# Patient Record
Sex: Female | Born: 1975 | Race: Black or African American | Hispanic: No | Marital: Married | Smoking: Never smoker
Health system: Southern US, Community
[De-identification: ages and names within clinical notes are randomized; demographics above are authoritative.]

## PROBLEM LIST (undated history)

## (undated) DIAGNOSIS — R0602 Shortness of breath: Secondary | ICD-10-CM

## (undated) DIAGNOSIS — F32A Depression, unspecified: Secondary | ICD-10-CM

## (undated) DIAGNOSIS — I10 Essential (primary) hypertension: Secondary | ICD-10-CM

## (undated) DIAGNOSIS — F419 Anxiety disorder, unspecified: Secondary | ICD-10-CM

## (undated) DIAGNOSIS — K219 Gastro-esophageal reflux disease without esophagitis: Secondary | ICD-10-CM

## (undated) DIAGNOSIS — G510 Bell's palsy: Secondary | ICD-10-CM

## (undated) DIAGNOSIS — E079 Disorder of thyroid, unspecified: Secondary | ICD-10-CM

## (undated) DIAGNOSIS — G500 Trigeminal neuralgia: Secondary | ICD-10-CM

## (undated) DIAGNOSIS — F329 Major depressive disorder, single episode, unspecified: Secondary | ICD-10-CM

## (undated) DIAGNOSIS — E119 Type 2 diabetes mellitus without complications: Secondary | ICD-10-CM

## (undated) DIAGNOSIS — E559 Vitamin D deficiency, unspecified: Secondary | ICD-10-CM

## (undated) DIAGNOSIS — D649 Anemia, unspecified: Secondary | ICD-10-CM

## (undated) HISTORY — PX: TUBAL LIGATION: SHX77

## (undated) HISTORY — DX: Anemia, unspecified: D64.9

## (undated) HISTORY — DX: Major depressive disorder, single episode, unspecified: F32.9

## (undated) HISTORY — DX: Gastro-esophageal reflux disease without esophagitis: K21.9

## (undated) HISTORY — DX: Trigeminal neuralgia: G50.0

## (undated) HISTORY — DX: Anxiety disorder, unspecified: F41.9

## (undated) HISTORY — DX: Depression, unspecified: F32.A

## (undated) HISTORY — DX: Vitamin D deficiency, unspecified: E55.9

## (undated) HISTORY — DX: Bell's palsy: G51.0

## (undated) HISTORY — DX: Shortness of breath: R06.02

---

## 1998-11-11 ENCOUNTER — Ambulatory Visit (HOSPITAL_COMMUNITY): Admission: RE | Admit: 1998-11-11 | Discharge: 1998-11-11 | Payer: Self-pay | Admitting: *Deleted

## 1999-02-17 ENCOUNTER — Encounter: Admission: RE | Admit: 1999-02-17 | Discharge: 1999-05-18 | Payer: Self-pay | Admitting: Gynecology

## 1999-04-06 ENCOUNTER — Encounter: Payer: Self-pay | Admitting: Gynecology

## 1999-04-06 ENCOUNTER — Encounter (HOSPITAL_COMMUNITY): Admission: RE | Admit: 1999-04-06 | Discharge: 1999-04-24 | Payer: Self-pay | Admitting: Gynecology

## 1999-04-22 ENCOUNTER — Inpatient Hospital Stay (HOSPITAL_COMMUNITY): Admission: AD | Admit: 1999-04-22 | Discharge: 1999-04-25 | Payer: Self-pay | Admitting: Gynecology

## 1999-04-22 ENCOUNTER — Encounter (INDEPENDENT_AMBULATORY_CARE_PROVIDER_SITE_OTHER): Payer: Self-pay

## 1999-04-25 ENCOUNTER — Encounter (HOSPITAL_COMMUNITY): Admission: RE | Admit: 1999-04-25 | Discharge: 1999-07-24 | Payer: Self-pay | Admitting: Gynecology

## 1999-05-29 ENCOUNTER — Other Ambulatory Visit: Admission: RE | Admit: 1999-05-29 | Discharge: 1999-05-29 | Payer: Self-pay | Admitting: Gynecology

## 2000-12-05 ENCOUNTER — Other Ambulatory Visit: Admission: RE | Admit: 2000-12-05 | Discharge: 2000-12-05 | Payer: Self-pay | Admitting: Gynecology

## 2001-04-11 ENCOUNTER — Encounter: Payer: Self-pay | Admitting: Emergency Medicine

## 2001-04-11 ENCOUNTER — Emergency Department (HOSPITAL_COMMUNITY): Admission: EM | Admit: 2001-04-11 | Discharge: 2001-04-11 | Payer: Self-pay | Admitting: Emergency Medicine

## 2001-08-08 ENCOUNTER — Encounter: Payer: Self-pay | Admitting: Emergency Medicine

## 2001-08-08 ENCOUNTER — Emergency Department (HOSPITAL_COMMUNITY): Admission: EM | Admit: 2001-08-08 | Discharge: 2001-08-08 | Payer: Self-pay | Admitting: *Deleted

## 2003-09-08 ENCOUNTER — Emergency Department (HOSPITAL_COMMUNITY): Admission: EM | Admit: 2003-09-08 | Discharge: 2003-09-08 | Payer: Self-pay | Admitting: Emergency Medicine

## 2004-03-21 ENCOUNTER — Emergency Department (HOSPITAL_COMMUNITY): Admission: EM | Admit: 2004-03-21 | Discharge: 2004-03-21 | Payer: Self-pay | Admitting: Family Medicine

## 2005-01-30 ENCOUNTER — Other Ambulatory Visit: Admission: RE | Admit: 2005-01-30 | Discharge: 2005-01-30 | Payer: Self-pay | Admitting: Obstetrics and Gynecology

## 2005-01-31 ENCOUNTER — Emergency Department (HOSPITAL_COMMUNITY): Admission: EM | Admit: 2005-01-31 | Discharge: 2005-01-31 | Payer: Self-pay | Admitting: Family Medicine

## 2005-04-20 ENCOUNTER — Emergency Department (HOSPITAL_COMMUNITY): Admission: EM | Admit: 2005-04-20 | Discharge: 2005-04-20 | Payer: Self-pay | Admitting: Family Medicine

## 2005-04-24 ENCOUNTER — Emergency Department (HOSPITAL_COMMUNITY): Admission: EM | Admit: 2005-04-24 | Discharge: 2005-04-24 | Payer: Self-pay | Admitting: Family Medicine

## 2006-01-27 ENCOUNTER — Emergency Department (HOSPITAL_COMMUNITY): Admission: EM | Admit: 2006-01-27 | Discharge: 2006-01-27 | Payer: Self-pay | Admitting: Emergency Medicine

## 2006-10-30 ENCOUNTER — Ambulatory Visit: Payer: Self-pay | Admitting: Internal Medicine

## 2006-11-04 ENCOUNTER — Emergency Department (HOSPITAL_COMMUNITY): Admission: EM | Admit: 2006-11-04 | Discharge: 2006-11-04 | Payer: Self-pay | Admitting: Emergency Medicine

## 2006-11-18 ENCOUNTER — Ambulatory Visit: Payer: Self-pay | Admitting: Internal Medicine

## 2006-11-18 LAB — CONVERTED CEMR LAB
ALT: 13 units/L (ref 0–40)
AST: 16 units/L (ref 0–37)
Alkaline Phosphatase: 60 units/L (ref 39–117)
BUN: 9 mg/dL (ref 6–23)
Basophils Absolute: 0 10*3/uL (ref 0.0–0.1)
Basophils Relative: 0.3 % (ref 0.0–1.0)
Bilirubin Urine: NEGATIVE
CO2: 28 meq/L (ref 19–32)
Calcium: 8.9 mg/dL (ref 8.4–10.5)
Chloride: 105 meq/L (ref 96–112)
Cholesterol: 140 mg/dL (ref 0–200)
Creatinine, Ser: 0.6 mg/dL (ref 0.4–1.2)
Crystals: NEGATIVE
Eosinophils Absolute: 0.1 10*3/uL (ref 0.0–0.6)
Eosinophils Relative: 1.4 % (ref 0.0–5.0)
GFR calc Af Amer: 153 mL/min
GFR calc non Af Amer: 127 mL/min
Glucose, Bld: 96 mg/dL (ref 70–99)
HCT: 32.1 % — ABNORMAL LOW (ref 36.0–46.0)
HDL: 45.3 mg/dL (ref >39.0)
Hemoglobin, Urine: NEGATIVE
Hemoglobin: 11.1 g/dL — ABNORMAL LOW (ref 12.0–15.0)
Ketones, ur: NEGATIVE mg/dL
LDL Cholesterol: 88 mg/dL (ref 0–99)
Leukocytes, UA: NEGATIVE
Lymphocytes Relative: 27.2 % (ref 12.0–46.0)
MCHC: 34.7 g/dL (ref 30.0–36.0)
MCV: 97.3 fL (ref 78.0–100.0)
Monocytes Absolute: 0.4 10*3/uL (ref 0.2–0.7)
Monocytes Relative: 7 % (ref 3.0–11.0)
Neutro Abs: 3.7 10*3/uL (ref 1.4–7.7)
Neutrophils Relative %: 64.1 % (ref 43.0–77.0)
Nitrite: NEGATIVE
Platelets: 348 10*3/uL (ref 150–400)
Potassium: 4.1 meq/L (ref 3.5–5.1)
RBC: 3.3 M/uL — ABNORMAL LOW (ref 3.87–5.11)
RDW: 11.9 % (ref 11.5–14.6)
Sed Rate: 37 mm/hr — ABNORMAL HIGH (ref 0–25)
Sodium: 139 meq/L (ref 135–145)
Specific Gravity, Urine: 1.02 (ref 1.000–1.03)
T3, Free: 3 pg/mL (ref 2.3–4.2)
T4, Total: 8.5 ug/dL (ref 5.0–12.5)
TSH: 0.93 microintl units/mL (ref 0.35–5.50)
Total CHOL/HDL Ratio: 3.1
Total Protein, Urine: NEGATIVE mg/dL
Triglycerides: 35 mg/dL (ref 0–149)
Urine Glucose: NEGATIVE mg/dL
Urobilinogen, UA: 0.2 (ref 0.0–1.0)
VLDL: 7 mg/dL (ref 0–40)
WBC, UA: NONE SEEN cells/hpf
WBC: 5.8 10*3/uL (ref 4.5–10.5)
pH: 6.5 (ref 5.0–8.0)

## 2006-12-08 ENCOUNTER — Emergency Department (HOSPITAL_COMMUNITY): Admission: EM | Admit: 2006-12-08 | Discharge: 2006-12-08 | Payer: Self-pay | Admitting: Family Medicine

## 2006-12-09 ENCOUNTER — Ambulatory Visit: Payer: Self-pay | Admitting: Internal Medicine

## 2007-01-27 ENCOUNTER — Emergency Department (HOSPITAL_COMMUNITY): Admission: EM | Admit: 2007-01-27 | Discharge: 2007-01-27 | Payer: Self-pay | Admitting: Family Medicine

## 2007-04-24 ENCOUNTER — Ambulatory Visit: Payer: Self-pay | Admitting: Internal Medicine

## 2007-04-24 LAB — CONVERTED CEMR LAB: hCG, Beta Chain, Quant, S: <0.5 milliintl units/mL

## 2008-06-26 ENCOUNTER — Emergency Department (HOSPITAL_COMMUNITY): Admission: EM | Admit: 2008-06-26 | Discharge: 2008-06-26 | Payer: Self-pay | Admitting: Family Medicine

## 2008-10-31 ENCOUNTER — Emergency Department (HOSPITAL_COMMUNITY): Admission: EM | Admit: 2008-10-31 | Discharge: 2008-10-31 | Payer: Self-pay | Admitting: Family Medicine

## 2009-03-08 ENCOUNTER — Ambulatory Visit: Payer: Self-pay

## 2009-06-20 ENCOUNTER — Ambulatory Visit: Payer: Self-pay | Admitting: Obstetrics and Gynecology

## 2009-06-29 ENCOUNTER — Ambulatory Visit (HOSPITAL_COMMUNITY): Admission: RE | Admit: 2009-06-29 | Discharge: 2009-06-29 | Payer: Self-pay | Admitting: Obstetrics and Gynecology

## 2009-10-31 ENCOUNTER — Inpatient Hospital Stay (HOSPITAL_COMMUNITY): Admission: RE | Admit: 2009-10-31 | Discharge: 2009-11-03 | Payer: Self-pay | Admitting: Obstetrics and Gynecology

## 2009-10-31 ENCOUNTER — Encounter (INDEPENDENT_AMBULATORY_CARE_PROVIDER_SITE_OTHER): Payer: Self-pay | Admitting: Obstetrics and Gynecology

## 2009-11-06 ENCOUNTER — Inpatient Hospital Stay (HOSPITAL_COMMUNITY): Admission: AD | Admit: 2009-11-06 | Discharge: 2009-11-06 | Payer: Self-pay | Admitting: Obstetrics and Gynecology

## 2009-12-27 ENCOUNTER — Emergency Department (HOSPITAL_COMMUNITY): Admission: EM | Admit: 2009-12-27 | Discharge: 2009-12-27 | Payer: Self-pay | Admitting: Emergency Medicine

## 2010-08-11 ENCOUNTER — Emergency Department (HOSPITAL_COMMUNITY): Admission: EM | Admit: 2010-08-11 | Discharge: 2010-08-11 | Payer: Self-pay | Admitting: Family Medicine

## 2010-09-13 DIAGNOSIS — G723 Periodic paralysis: Secondary | ICD-10-CM | POA: Insufficient documentation

## 2010-09-20 DIAGNOSIS — D649 Anemia, unspecified: Secondary | ICD-10-CM | POA: Insufficient documentation

## 2010-09-20 DIAGNOSIS — E559 Vitamin D deficiency, unspecified: Secondary | ICD-10-CM | POA: Insufficient documentation

## 2010-12-19 LAB — POCT I-STAT, CHEM 8
BUN: 13 mg/dL (ref 6–23)
Calcium, Ion: 1.21 mmol/L (ref 1.12–1.32)
Chloride: 102 mEq/L (ref 96–112)
Creatinine, Ser: 0.8 mg/dL (ref 0.4–1.2)
Glucose, Bld: 91 mg/dL (ref 70–99)
HCT: 33 % — ABNORMAL LOW (ref 36.0–46.0)
Hemoglobin: 11.2 g/dL — ABNORMAL LOW (ref 12.0–15.0)
Potassium: 3.6 mEq/L (ref 3.5–5.1)
Sodium: 140 mEq/L (ref 135–145)
TCO2: 27 mmol/L (ref 0–100)

## 2010-12-24 LAB — CBC
HCT: 26.3 % — ABNORMAL LOW (ref 36.0–46.0)
HCT: 27 % — ABNORMAL LOW (ref 36.0–46.0)
HCT: 32.1 % — ABNORMAL LOW (ref 36.0–46.0)
Hemoglobin: 8.6 g/dL — ABNORMAL LOW (ref 12.0–15.0)
Hemoglobin: 9 g/dL — ABNORMAL LOW (ref 12.0–15.0)
MCHC: 32.8 g/dL (ref 30.0–36.0)
Platelets: 264 10*3/uL (ref 150–400)
Platelets: 314 10*3/uL (ref 150–400)
Platelets: 471 10*3/uL — ABNORMAL HIGH (ref 150–400)
RBC: 2.8 MIL/uL — ABNORMAL LOW (ref 3.87–5.11)
RDW: 13.8 % (ref 11.5–15.5)
RDW: 14 % (ref 11.5–15.5)
WBC: 7.1 10*3/uL (ref 4.0–10.5)
WBC: 8.8 10*3/uL (ref 4.0–10.5)

## 2010-12-24 LAB — URIC ACID: Uric Acid, Serum: 4.7 mg/dL (ref 2.4–7.0)

## 2010-12-24 LAB — RPR: RPR Ser Ql: NONREACTIVE

## 2010-12-24 LAB — BASIC METABOLIC PANEL
BUN: 5 mg/dL — ABNORMAL LOW (ref 6–23)
Calcium: 8.5 mg/dL (ref 8.4–10.5)
GFR calc non Af Amer: >60 mL/min (ref >60)
Glucose, Bld: 104 mg/dL — ABNORMAL HIGH (ref 70–99)
Potassium: 3.6 mEq/L (ref 3.5–5.1)
Sodium: 134 mEq/L — ABNORMAL LOW (ref 135–145)

## 2010-12-24 LAB — URINALYSIS, DIPSTICK ONLY
Glucose, UA: NEGATIVE mg/dL
Leukocytes, UA: NEGATIVE
Specific Gravity, Urine: 1.01 (ref 1.005–1.030)
pH: 6.5 (ref 5.0–8.0)

## 2010-12-24 LAB — COMPREHENSIVE METABOLIC PANEL
Albumin: 2.1 g/dL — ABNORMAL LOW (ref 3.5–5.2)
Albumin: 2.5 g/dL — ABNORMAL LOW (ref 3.5–5.2)
Alkaline Phosphatase: 89 U/L (ref 39–117)
BUN: 3 mg/dL — ABNORMAL LOW (ref 6–23)
BUN: 7 mg/dL (ref 6–23)
CO2: 27 mEq/L (ref 19–32)
Calcium: 8.3 mg/dL — ABNORMAL LOW (ref 8.4–10.5)
Chloride: 103 mEq/L (ref 96–112)
Glucose, Bld: 164 mg/dL — ABNORMAL HIGH (ref 70–99)
Potassium: 4.2 mEq/L (ref 3.5–5.1)
Total Bilirubin: 0.3 mg/dL (ref 0.3–1.2)
Total Protein: 4.7 g/dL — ABNORMAL LOW (ref 6.0–8.3)

## 2010-12-24 LAB — GLUCOSE, CAPILLARY: Glucose-Capillary: 95 mg/dL (ref 70–99)

## 2010-12-24 LAB — LACTATE DEHYDROGENASE: LDH: 173 U/L (ref 94–250)

## 2011-01-22 LAB — POCT URINALYSIS DIP (DEVICE)
Protein, ur: NEGATIVE mg/dL
Specific Gravity, Urine: 1.025 (ref 1.005–1.030)
Urobilinogen, UA: 0.2 mg/dL (ref 0.0–1.0)

## 2011-01-22 LAB — POCT I-STAT, CHEM 8
Creatinine, Ser: 0.9 mg/dL (ref 0.4–1.2)
Glucose, Bld: 82 mg/dL (ref 70–99)
Hemoglobin: 11.9 g/dL — ABNORMAL LOW (ref 12.0–15.0)
Sodium: 141 mEq/L (ref 135–145)
TCO2: 25 mmol/L (ref 0–100)

## 2011-01-22 LAB — POCT PREGNANCY, URINE: Preg Test, Ur: NEGATIVE

## 2011-02-20 DIAGNOSIS — R070 Pain in throat: Secondary | ICD-10-CM | POA: Insufficient documentation

## 2011-02-23 NOTE — Assessment & Plan Note (Signed)
[facility]                           PRIMARY CARE OFFICE NOTE   MADINA, GALATI                    MRN:          [id number]  DATE:10/30/2006                            DOB:          07-18-1976    CHIEF COMPLAINT:  New patient to practice.   HISTORY OF PRESENT ILLNESS:  The patient is  a 35 year old African-  American female here to establish primary care.  She has not had a  primary care physician in the past.  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   She has been fairly healthy for most of her life, however in 2005 she  had abnormal sensation in the left side of the face and was seen in the  emergency department.  They made a presumptive diagnosis of possible  trigeminal neuralgia versus Bell's palsy.  She describes having numbness  sensation in the left side of her face.  She did not report any muscle  weakness of the face.   Since that time, she has not had any further recurrences.  She does  occasionally get headaches which she attributes to stress. When she was  working in the ICU, she had frequent headaches, one to two per work.  Now that she is in a less stressful job, she has not had any recurrence  of headaches.   She occasionally experiences tightness of her chest during stressful  time periods.  She has never experienced chest pain with vigorous  exercise.  She runs on a treadmill several times a week.  Chest  discomfort is intermittent and only lasts a few seconds.  She denies any  history of gastroesophageal reflux disease.  In addition, there are no  other associated symptoms such as neck discomfort, left arm discomfort,  diaphoresis, or shortness of breath.   SOCIAL HISTORY:  The patient is single.  She does have 2 children,  twins, a boy and a girl age 36.  Employment history is noted above.   CURRENT  MEDICATIONS:  None.   ALLERGIES:  To medications none.   FAMILY HISTORY:  Father's medical history is unknown.  Mother is alive  at age 38, has hypertension and over-active thyroid.  Similar issues  with her sister.  Aunt is known to have breast cancer.   HABITS:  She seldom drinks.  No history of tobacco use or illicit drug  use in the past.   PREVENTATIVE CARE HISTORY:  Her last Pap was in 2007.  She has never had  a mammogram in the past.   REVIEW OF SYSTEMS:  As noted above, no further issues with headache.  The patient denies any double vision, blurry vision, no HEENT symptoms,  cardiovascular or chest pain symptoms as noted above.  Denies heartburn,  nausea and vomiting, constipation, diarrhea.  No history of stumbling,  no history of dropping items.  All systems negative.   PHYSICAL EXAMINATION:  VITALS:  Height is 5 feet 6 inches, weight is 190  pounds,  temperature is 97.5, pulse 58, blood pressure is 120/78 on the  left side in a seated position.  IN GENERAL:  The patient is a pleasant, somewhat overweight 35 year old  African-American female in no apparent distress.  HEENT:  Normocephalic and atraumatic.  Pupils are equal and reactive to  light.  Extraocular movements intact.  The patient is anicteric.  Conjunctiva is within normal limits.  Oropharyngeal exam is  unremarkable.  Gross testing of her face to light touch and pin prick  was equal and symmetric on both sides.  No appreciable defect in her  facial muscles when compared side by side.  LUNGS:  Clear bilaterally.  NECK:  Supple, somewhat thickened but no adenopathy, carotid bruits or  thyromegaly.  CHEST:  Normal expiratory effort.  Chest was clear to auscultation  bilaterally.  No rhonchi, rales or wheezing.  CARDIOVASCULAR:  Regular rate and rhythm, no significant murmurs, rubs  or gallops appreciated.  ABDOMEN:  Slightly protuberant, nontender, positive bowel sounds, no  organomegaly.  MUSCULOSKELETAL:  No  cyanosis, clubbing or edema.  SKIN:  Warm and dry.  NEUROLOGIC:  Cranial nerves II-XII grossly intact.  She was nonfocal.  Reflexes were +1 to 2 upper and lower, symmetric.  No Babinski noted.   IMPRESSION/RECOMMENDATIONS:  1. Remote history of trigeminal neuropathy.  2. Intermittent atypical chest pain.  3. Obesity.  4. Health maintenance.   RECOMMENDATIONS:  At this time, the patient has no neurologic complaints  and no neurologic abnormality that would warrant further testing or  imaging.  The patient had a chest x-ray today with complaints of chest  tightness and wanted to rule out possibility of sarcoidosis.  Her chest  x-ray was unremarkable.  There was no active disease, no other  adenopathy.   We discussed various strategies for weight loss.  We will rule out any  endocrine reason for difficulty with weight loss.  Thyroid studies will  be checked and also she will be screened for diabetes.   If the patient has any further issues with headache or abnormal  sensation in the face, we discussed ordering an MRI of the brain.   Follow up times in approximately 6 weeks.     Barbette Hair. Artist Pais, DO  Electronically Signed    RDY/MedQ  DD: 10/30/2006  DT: 10/30/2006  Job #: [id number]

## 2011-04-02 DIAGNOSIS — R197 Diarrhea, unspecified: Secondary | ICD-10-CM | POA: Insufficient documentation

## 2011-04-02 DIAGNOSIS — R109 Unspecified abdominal pain: Secondary | ICD-10-CM | POA: Insufficient documentation

## 2011-05-24 ENCOUNTER — Ambulatory Visit: Payer: Self-pay | Admitting: Obstetrics and Gynecology

## 2011-06-04 ENCOUNTER — Encounter (INDEPENDENT_AMBULATORY_CARE_PROVIDER_SITE_OTHER): Payer: Self-pay

## 2011-06-18 DIAGNOSIS — L732 Hidradenitis suppurativa: Secondary | ICD-10-CM | POA: Insufficient documentation

## 2011-06-26 ENCOUNTER — Ambulatory Visit (INDEPENDENT_AMBULATORY_CARE_PROVIDER_SITE_OTHER): Payer: PRIVATE HEALTH INSURANCE | Admitting: General Surgery

## 2011-07-03 ENCOUNTER — Ambulatory Visit (INDEPENDENT_AMBULATORY_CARE_PROVIDER_SITE_OTHER): Payer: PRIVATE HEALTH INSURANCE | Admitting: General Surgery

## 2011-10-07 ENCOUNTER — Emergency Department (INDEPENDENT_AMBULATORY_CARE_PROVIDER_SITE_OTHER)
Admission: EM | Admit: 2011-10-07 | Discharge: 2011-10-07 | Disposition: A | Discharge status: Home / Self Care | Payer: PRIVATE HEALTH INSURANCE | Source: Home / Self Care | Attending: Emergency Medicine | Admitting: Emergency Medicine

## 2011-10-07 ENCOUNTER — Encounter (HOSPITAL_COMMUNITY): Payer: Self-pay | Admitting: *Deleted

## 2011-10-07 DIAGNOSIS — J40 Bronchitis, not specified as acute or chronic: Secondary | ICD-10-CM

## 2011-10-07 MED ORDER — ALBUTEROL SULFATE HFA 108 (90 BASE) MCG/ACT IN AERS: 1.0000 | INHALATION_SPRAY | Freq: Four times a day (QID) | RESPIRATORY_TRACT | Status: DC | PRN

## 2011-10-07 MED ORDER — GUAIFENESIN-CODEINE 100-10 MG/5ML PO SYRP: 10.0000 mL | ORAL_SOLUTION | Freq: Four times a day (QID) | ORAL | Status: AC | PRN

## 2011-10-07 MED ORDER — AZITHROMYCIN 250 MG PO TABS: ORAL_TABLET | ORAL | Status: AC

## 2011-10-07 NOTE — ED Provider Notes (Signed)
History     CSN: [id number]  Arrival date & time 10/07/11  1110   First MD Initiated Contact with Patient 10/07/11 1227      Chief Complaint  Patient presents with  . Cough  . Nasal Congestion  . Fever    (Consider location/radiation/quality/duration/timing/severity/associated sxs/prior treatment) HPI Comments: Melissa Franklin is a 35 year old female who has had a two-week history of a cough that seems to be getting worse. Sometimes nonproductive and sometimes productive of moderate amounts of yellow to brown sputum. She denies any hemoptysis. Her chest hurts when she coughs and she feels moderately short of breath when she coughs. She has had some wheezing. She denies any history of asthma. She notes aching in her neck and she's felt hot but denies any fever, chills, nasal congestion, rhinorrhea, sore throat, vomiting, or diarrhea. She has been exposed to several family members who have influenza-like illnesses.  Patient is a 35 y.o. female presenting with cough and fever.  Cough Associated symptoms include chest pain, myalgias and wheezing. Pertinent negatives include no chills, no ear pain, no rhinorrhea, no sore throat, no shortness of breath and no eye redness.  Fever Primary symptoms of the febrile illness include fever, cough, wheezing and myalgias. Primary symptoms do not include fatigue, shortness of breath, abdominal pain, nausea, vomiting, diarrhea or rash.    Past Medical History  Diagnosis Date  . Anxiety   . SOB (shortness of breath)   . Depression     Past Surgical History  Procedure Date  . Tubal ligation     Family History  Problem Relation Age of Onset  . Diabetes    . Breast cancer Maternal Aunt     2 maternal aunts and cousin  . Thyroid disease      History  Substance Use Topics  . Smoking status: Never Smoker   . Smokeless tobacco: Not on file  . Alcohol Use: No    OB History    Grav Para Term Preterm Abortions TAB SAB Ect Mult Living            Review of Systems  Constitutional: Positive for fever. Negative for chills and fatigue.  HENT: Negative for ear pain, congestion, sore throat, rhinorrhea, sneezing, neck stiffness, voice change and postnasal drip.   Eyes: Negative for pain, discharge and redness.  Respiratory: Positive for cough and wheezing. Negative for chest tightness and shortness of breath.   Cardiovascular: Positive for chest pain.  Gastrointestinal: Negative for nausea, vomiting, abdominal pain and diarrhea.  Musculoskeletal: Positive for myalgias.  Skin: Negative for rash.    Allergies  Review of patient's allergies indicates no known allergies.  Home Medications   Current Outpatient Rx  Name Route Sig Dispense Refill  . VITAMIN D PO Oral Take by mouth.      . NYQUIL PO Oral Take by mouth.      . DAYQUIL PO Oral Take by mouth.      . SERTRALINE HCL 50 MG PO TABS Oral Take 50 mg by mouth daily.      . ALBUTEROL SULFATE HFA 108 (90 BASE) MCG/ACT IN AERS Inhalation Inhale 1-2 puffs into the lungs every 6 (six) hours as needed for wheezing. 1 Inhaler 0  . AZITHROMYCIN 250 MG PO TABS  Take as directed. 6 tablet 0  . IRON CR PO Oral Take by mouth.      . GUAIFENESIN-CODEINE 100-10 MG/5ML PO SYRP Oral Take 10 mLs by mouth 4 (four) times daily as needed for  cough. 120 mL 0    BP 124/75  Pulse 77  Temp(Src) 99.2 F (37.3 C) (Oral)  Resp 18  SpO2 100%  LMP 09/07/2011  Physical Exam  Nursing note and vitals reviewed. Constitutional: She appears well-developed and well-nourished. No distress.  HENT:  Head: Normocephalic and atraumatic.  Right Ear: External ear normal.  Left Ear: External ear normal.  Nose: Nose normal.  Mouth/Throat: Oropharynx is clear and moist. No oropharyngeal exudate.  Eyes: Conjunctivae and EOM are normal. Pupils are equal, round, and reactive to light. Right eye exhibits no discharge. Left eye exhibits no discharge.  Neck: Normal range of motion. Neck supple.    Cardiovascular: Normal rate, regular rhythm and normal heart sounds.   Pulmonary/Chest: Effort normal and breath sounds normal. No stridor. No respiratory distress. She has no wheezes. She has no rales. She exhibits no tenderness.  Lymphadenopathy:    She has no cervical adenopathy.  Skin: Skin is warm and dry. No rash noted. She is not diaphoretic.    ED Course  Procedures (including critical care time)  Labs Reviewed - No data to display No results found.   1. Bronchitis       MDM  She has symptoms of bronchitis which been going on for over 2 weeks. I think at this point she needs antibiotics and have given her prescriptions for Z-Pak, guaifenesin/codeine cough syrup, and an albuterol inhaler.        Roque Lias, MD 10/07/11 [phone]

## 2011-10-07 NOTE — ED Notes (Signed)
Pt with osnet of cough and congestion x 2 weeks progressively worse now chest sore with coughing rates pain 8 with cough -

## 2011-12-19 DIAGNOSIS — K219 Gastro-esophageal reflux disease without esophagitis: Secondary | ICD-10-CM | POA: Insufficient documentation

## 2011-12-19 DIAGNOSIS — Z0289 Encounter for other administrative examinations: Secondary | ICD-10-CM | POA: Insufficient documentation

## 2011-12-19 DIAGNOSIS — E669 Obesity, unspecified: Secondary | ICD-10-CM | POA: Insufficient documentation

## 2011-12-19 DIAGNOSIS — F32A Depression, unspecified: Secondary | ICD-10-CM | POA: Insufficient documentation

## 2012-08-07 DIAGNOSIS — J0100 Acute maxillary sinusitis, unspecified: Secondary | ICD-10-CM | POA: Insufficient documentation

## 2012-08-07 DIAGNOSIS — H6690 Otitis media, unspecified, unspecified ear: Secondary | ICD-10-CM | POA: Insufficient documentation

## 2012-10-11 ENCOUNTER — Emergency Department (HOSPITAL_COMMUNITY)
Admission: EM | Admit: 2012-10-11 | Discharge: 2012-10-11 | Disposition: A | Discharge status: Home / Self Care | Payer: BC Managed Care – PPO | Attending: Emergency Medicine | Admitting: Emergency Medicine

## 2012-10-11 ENCOUNTER — Encounter (HOSPITAL_COMMUNITY): Payer: Self-pay | Admitting: Nurse Practitioner

## 2012-10-11 ENCOUNTER — Emergency Department (HOSPITAL_COMMUNITY): Payer: BC Managed Care – PPO

## 2012-10-11 DIAGNOSIS — Z8659 Personal history of other mental and behavioral disorders: Secondary | ICD-10-CM | POA: Insufficient documentation

## 2012-10-11 DIAGNOSIS — Z8709 Personal history of other diseases of the respiratory system: Secondary | ICD-10-CM | POA: Insufficient documentation

## 2012-10-11 DIAGNOSIS — R209 Unspecified disturbances of skin sensation: Secondary | ICD-10-CM | POA: Insufficient documentation

## 2012-10-11 DIAGNOSIS — R0789 Other chest pain: Secondary | ICD-10-CM | POA: Insufficient documentation

## 2012-10-11 DIAGNOSIS — R079 Chest pain, unspecified: Secondary | ICD-10-CM

## 2012-10-11 LAB — BASIC METABOLIC PANEL
BUN: 10 mg/dL (ref 6–23)
Creatinine, Ser: 0.76 mg/dL (ref 0.50–1.10)
GFR calc non Af Amer: >90 mL/min (ref >90)
Glucose, Bld: 96 mg/dL (ref 70–99)
Potassium: 3.7 mEq/L (ref 3.5–5.1)

## 2012-10-11 LAB — CBC
HCT: 31.8 % — ABNORMAL LOW (ref 36.0–46.0)
Hemoglobin: 10.2 g/dL — ABNORMAL LOW (ref 12.0–15.0)
MCH: 30 pg (ref 26.0–34.0)
MCHC: 32.1 g/dL (ref 30.0–36.0)
RDW: 13 % (ref 11.5–15.5)

## 2012-10-11 LAB — POCT I-STAT TROPONIN I: Troponin i, poc: 0 ng/mL (ref 0.00–0.08)

## 2012-10-11 NOTE — ED Provider Notes (Signed)
History     CSN: [id number]  Arrival date & time 10/11/12  1257   First MD Initiated Contact with Patient 10/11/12 1505      Chief Complaint  Patient presents with  . Chest Pain    (Consider location/radiation/quality/duration/timing/severity/associated sxs/prior treatment) HPI Melissa Franklin is a 37 y.o. female who presents with complaint of chest pain, left arm and left leg numbness. States symptoms first began with left jaw discomfort that she noted about a year ago. States went to a dentist, was told she may have tigeminal neuralgia and got mouth guards. 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 Stats also more recently noted that her left arm would swell at the wrist joint, and would have left arm discomfort that also comes and goes and non exertional. States today she was laying in bed and started feeling pressure in left chest. She states she did not have associated jaw pain, left arm pain, or left leg pain or numbness or tingling. Denies shortness of breath, nausea, diaphoresis. States has been stressed out due to family deaths and other stressors. Thought it may be anxiety, took xanax with no improvement. Pt denies any medical problems, denies family hx of cardiac problems, denies hx of the same. Not a smoker. Not on birth control, no recent travel or surgeries.   Past Medical History  Diagnosis Date  . Anxiety   . SOB (shortness of breath)   . Depression     Past Surgical History  Procedure Date  . Tubal ligation   . Cesarean section     Family History  Problem Relation Age of Onset  . Diabetes    . Breast cancer Maternal Aunt     2 maternal aunts and cousin  . Thyroid disease      History  Substance Use Topics  . Smoking status: Never Smoker   . Smokeless tobacco: Not on file  . Alcohol Use: No    OB History    Grav Para Term Preterm Abortions TAB SAB Ect Mult Living                  Review of Systems  Constitutional: Negative for fever and chills.  HENT: Negative for neck pain and neck stiffness.   Respiratory: Positive for chest tightness. Negative for cough and shortness of breath.   Cardiovascular: Positive for chest pain. Negative for palpitations and leg swelling.  Neurological: Negative for weakness and numbness.  All other systems reviewed and are negative.    Allergies  Review of patient's allergies indicates no known allergies.  Home Medications   Current Outpatient Rx  Name  Route  Sig  Dispense  Refill  . OMEPRAZOLE 40 MG PO CPDR   Oral   Take 40 mg by mouth daily.         . SERTRALINE HCL 100 MG PO TABS   Oral   Take 100 mg by mouth daily.         Marland Kitchen VITAMIN D (ERGOCALCIFEROL) 50000 UNITS PO CAPS   Oral   Take 50,000 Units by mouth 3 (three) times a week.           BP 125/70  Pulse 85  Temp 97.8 F (36.6 C) (Oral)  Resp 15  SpO2 99%  Physical Exam  Nursing note and vitals reviewed. Constitutional: She is oriented to person, place, and time. She appears  well-developed and well-nourished. No distress.  Neck: Neck supple.  Cardiovascular: Normal rate, regular rhythm and normal heart sounds.   Pulmonary/Chest: Effort normal and breath sounds normal. No respiratory distress. She has no wheezes. She has no rales.  Abdominal: Soft. Bowel sounds are normal. She exhibits no distension. There is no tenderness. There is no rebound.  Musculoskeletal: She exhibits no edema.       Normal appearing left arm, hand, with no swelling or tenderness. Normal distal radial pulse. Normal appearing left leg with no swelling, edema, tenderness. Normal dorsal pedal pulses bilaterally  Neurological: She is alert and oriented to person, place, and time.  Skin: Skin is warm and dry.  Psychiatric: She has a normal mood and affect. Her behavior is normal.    ED Course  Procedures (including critical care time)   Date: 10/11/2012   Rate: 98  Rhythm: normal sinus rhythm  QRS Axis: normal  Intervals: normal  ST/T Wave abnormalities: normal  Conduction Disutrbances:none  Narrative Interpretation:   Old EKG Reviewed: none available  Pt with atypical chest pain. She is PERC negative. She is non toxic appearing. Currently chest pain free. Labs as follow   Results for orders placed during the hospital encounter of 10/11/12  BASIC METABOLIC PANEL      Component Value Range   Sodium 136  135 - 145 mEq/L   Potassium 3.7  3.5 - 5.1 mEq/L   Chloride 101  96 - 112 mEq/L   CO2 28  19 - 32 mEq/L   Glucose, Bld 96  70 - 99 mg/dL   BUN 10  6 - 23 mg/dL   Creatinine, Ser 1.61  0.50 - 1.10 mg/dL   Calcium 9.2  8.4 - 09.6 mg/dL   GFR calc non Af Amer >90  >90 mL/min   GFR calc Af Amer >90  >90 mL/min  CBC      Component Value Range   WBC 6.6  4.0 - 10.5 K/uL   RBC 3.40 (*) 3.87 - 5.11 MIL/uL   Hemoglobin 10.2 (*) 12.0 - 15.0 g/dL   HCT 04.5 (*) 40.9 - 81.1 %   MCV 93.5  78.0 - 100.0 fL   MCH 30.0  26.0 - 34.0 pg   MCHC 32.1  30.0 - 36.0 g/dL   RDW 91.4  78.2 - 95.6 %   Platelets 344  150 - 400 K/uL  POCT I-STAT TROPONIN I      Component Value Range   Troponin i, poc 0.00  0.00 - 0.08 ng/mL   Comment 3            Dg Chest 2 View  10/11/2012  *RADIOLOGY REPORT*  Clinical Data: Chest pain.  Left upper extremity weakness.  CHEST - 2 VIEW  Comparison: None.  Findings: Cardiomediastinal silhouette unremarkable.  Lungs clear. Bronchovascular markings normal.  Pulmonary vascularity normal.  No pleural effusions.  No pneumothorax.  Visualized bony thorax intact.  IMPRESSION: Normal examination.   Original Report Authenticated By: Hulan Saas, M.D.      6:03 PM 2nd troponin is negative. Pt is pain free. VS normal.  1. Chest pain       MDM  Pt with intermittent left arm and leg tingling for "months" and CP that started today, non exertional, lasting less than a minute. Pain sounds atypical. She has NO risk factors for  CAD. She is TIMI 0. She is PERC negative. VS are normal. Doubt ACS, doubt PE. No signs of pericarditis. Will d/c  home in stable condition with PCP follow up in 2-3 days.   Filed Vitals:   10/11/12 1650  BP: 112/65  Pulse: 83  Temp:   Resp: 15           Oprah Camarena A Simrat Kendrick, PA 10/11/12 1806

## 2012-10-11 NOTE — ED Notes (Addendum)
Reports she "has been under a lot of stress for a while" and has been clenching her teeth and feeling intermittent numbness in L leg. Then today began to feel "Tightness" in chest and feels L arm is swollen. Pain has been intermittent throughout the day today, no noted activity at onset, nothing relieves the pain. No pain at this time. A&Ox4, resp e/u. Pt did take a xanax that made her feel sleepy but didn't change teh pain

## 2012-10-11 NOTE — ED Notes (Signed)
Pt reports left arm "tingling/numbness" x 1 week. Today had intermittent left side cp today while lying on the bed. Denies diaphoresis or n/v. "I've been under a lot of stress lately".

## 2012-10-12 NOTE — ED Provider Notes (Signed)
Medical screening examination/treatment/procedure(s) were performed by non-physician practitioner and as supervising physician I was immediately available for consultation/collaboration.  Arthelia Callicott T Maika Mcelveen, MD 10/12/12 1742 

## 2013-01-12 DIAGNOSIS — R202 Paresthesia of skin: Secondary | ICD-10-CM | POA: Insufficient documentation

## 2013-01-12 DIAGNOSIS — R4586 Emotional lability: Secondary | ICD-10-CM | POA: Insufficient documentation

## 2013-01-12 DIAGNOSIS — N939 Abnormal uterine and vaginal bleeding, unspecified: Secondary | ICD-10-CM | POA: Insufficient documentation

## 2013-03-09 DIAGNOSIS — G500 Trigeminal neuralgia: Secondary | ICD-10-CM | POA: Insufficient documentation

## 2013-03-18 ENCOUNTER — Encounter: Payer: Self-pay | Admitting: Neurology

## 2013-03-18 ENCOUNTER — Ambulatory Visit (INDEPENDENT_AMBULATORY_CARE_PROVIDER_SITE_OTHER): Payer: BC Managed Care – PPO | Admitting: Neurology

## 2013-03-18 VITALS — BP 119/79 | HR 74 | Ht 66.5 in | Wt 230.0 lb

## 2013-03-18 DIAGNOSIS — G500 Trigeminal neuralgia: Secondary | ICD-10-CM

## 2013-03-18 NOTE — Progress Notes (Signed)
History of present illness:  Melissa Franklin is a 37 years old right-handed African American female, referred by her primary care physician Dr. Eloise Harman for evaluation of left facial discomfort  She had past medical history of depression, anemia, presenting with left facial discomfort since May 2014, there was no clear trigger, she noticed left upper and lower lip numbness, sensitive to touch, later spreading to her left cheek, left the skull, left chin, she described almost constant numbness, tingling, burning achy pain, at its worst, it was 10 out of 10, she took gabapentin, 100 mg does help her, but she complains of lightheadedness, could not take it during the daytime, she also tried tramadol, complains of jitteriness,  She complains of six-month history of intermittent left arm weakness, sweating sensation, decreased memory,  About 10 years ago, she had one similar episode, left facial numbness, was seen by Dr. love, there was no clear etiology found,  CAT scan of the brain without contrast was normal in 2002, in 2004, She was getting prednisone tapering, 10 mg, 6 tablets to start with, starting since June ninth   Review of system: Patient complains of only the following symptoms, and all other reviewed systems are negative.   Constitutional:   Fatigue Cardiovascular: Swelling in legs Ear/Nose/Throat:  Hearing loss  Skin: N/A Eyes:  blurry vision  Respiratory: shortness of breath  Gastroitestinal: N/A    Hematology/Lymphatic:  N/A Endocrine:  N/A Musculoskeletal: joints sweating  Allergy/Immunology: N/A Neurological: memory loss, numbness, weakness Psychiatric:   Depression, anxiety, decreased energy.  PHYSICAL EXAMINATOINS:  Generalized: In no acute distress  Neck: Supple, no carotid bruits   Cardiac: Regular rate rhythm  Pulmonary: Clear to auscultation bilaterally  Musculoskeletal: No deformity  Neurological examination  Mentation: Alert oriented to time, place,  history taking, and causual conversation  Cranial nerve II-XII: Pupils were equal round reactive to light extraocular movements were full, visual field were full on confrontational test. facial sensation and strength were normal. hearing was intact to finger rubbing bilaterally. Uvula tongue midline.  head turning and shoulder shrug and were normal and symmetric.Tongue protrusion into cheek strength was normal.  Motor: normal tone, bulk and strength.  Sensory: Intact to fine touch, pinprick, preserved vibratory sensation, and proprioception at toes with exception of decreased light touch at left cheek, left chin.  Coordination: Normal finger to nose, heel-to-shin bilaterally there was no truncal ataxia  Gait: Rising up from seated position without assistance, normal stance, without trunk ataxia, moderate stride, good arm swing, smooth turning, able to perform tiptoe, and heel walking without difficulty.   Romberg signs: Negative  Deep tendon reflexes: Brachioradialis 2/2, biceps 2/2, triceps 2/2, patellar 2/2, Achilles 2/2, plantar responses were flexor bilaterally.  Assessment and plan: 37 years old right-handed [place] American female, presenting with 6 weeks history of left facial hypersensitivity, numbness burning pain,  1. need to rule out.the brainstem, left trigeminal nerve pathology, 2. MRI of the brain with and without contrast, 3 gabapentin 100 mg every night, continue prednisone tapering 4.Return to clinic in one month.

## 2013-03-25 ENCOUNTER — Ambulatory Visit: Payer: BC Managed Care – PPO | Admitting: Diagnostic Neuroimaging

## 2013-03-26 ENCOUNTER — Ambulatory Visit (INDEPENDENT_AMBULATORY_CARE_PROVIDER_SITE_OTHER): Payer: BC Managed Care – PPO

## 2013-03-26 DIAGNOSIS — G500 Trigeminal neuralgia: Secondary | ICD-10-CM

## 2013-03-27 MED ORDER — GADOPENTETATE DIMEGLUMINE 469.01 MG/ML IV SOLN: 20.0000 mL | Freq: Once | INTRAVENOUS | Status: AC | PRN

## 2013-03-27 NOTE — Progress Notes (Signed)
Quick Note:  Please call patient, there is no significant abnormalities on MRI brain ______

## 2013-03-31 ENCOUNTER — Telehealth: Payer: Self-pay

## 2013-03-31 MED ORDER — GABAPENTIN (ONCE-DAILY) 300 MG PO TABS: 300.0000 mg | ORAL_TABLET | Freq: Three times a day (TID) | ORAL | Status: AC

## 2013-03-31 NOTE — Telephone Encounter (Signed)
I have called her, she continues to complains of left facial numbness, burning discomfort. MRI brain showed no significant abnormalities  I have called in gabapentin 300mg  tid. Keep follow up appt.

## 2013-03-31 NOTE — Telephone Encounter (Signed)
Called pt gave MRI results. Pt requesting to know if there is anything else she can do for her facial numbness. We went over last OV note plan, attempted to schedule 1 month f/u visit. Patient says she is taking gabapentin and has stopped prednisone as directed, but feels she needs to be seen earlier than one month b/c cannot function w/ facial numbness.

## 2013-03-31 NOTE — Telephone Encounter (Signed)
Message copied by Doree Barthel on Tue Mar 31, 2013 10:23 AM ------      Message from: Levert Feinstein      Created: Fri Mar 27, 2013  4:52 PM       Please call patient, there is no significant abnormalities on MRI brain ------

## 2013-04-09 ENCOUNTER — Telehealth: Payer: Self-pay | Admitting: Neurology

## 2013-04-17 NOTE — Telephone Encounter (Signed)
Called patient back and she states that she is having pain on right side of her face. Patient states her tongue is numb. Patient states she does not want any more medication she just wants to know what she should do next.

## 2013-04-17 NOTE — Telephone Encounter (Signed)
I have called patient, she is taking gabapentin 100mg  tid, which has caused sleepiness during day time.   MRI brain w/wo was normal. She is to continue Gabapentin, she does not want to increase the dosage.

## 2014-05-14 ENCOUNTER — Ambulatory Visit: Payer: PRIVATE HEALTH INSURANCE | Admitting: Dietician

## 2014-08-09 DIAGNOSIS — K921 Melena: Secondary | ICD-10-CM | POA: Insufficient documentation

## 2014-09-29 ENCOUNTER — Encounter: Payer: Self-pay | Admitting: *Deleted

## 2014-10-07 ENCOUNTER — Ambulatory Visit: Payer: PRIVATE HEALTH INSURANCE | Admitting: Gastroenterology

## 2014-10-07 NOTE — Progress Notes (Signed)
Patient ID: Melissa Franklin, female   DOB: 06/30/1976, 38 y.o.   MRN: [id number] The patient's chart has been reviewed by Dr. Russella DarStark  and the recommendations are noted below.   Follow-up advised. Contact patient and schedule visit in 2-3 weeks.  Outcome of communication with the patient: Called patient with no answer. Letter mailed.

## 2015-10-27 ENCOUNTER — Encounter (HOSPITAL_COMMUNITY): Payer: Self-pay

## 2015-10-27 ENCOUNTER — Emergency Department (INDEPENDENT_AMBULATORY_CARE_PROVIDER_SITE_OTHER)
Admission: EM | Admit: 2015-10-27 | Discharge: 2015-10-27 | Disposition: A | Discharge status: Home / Self Care | Payer: Managed Care, Other (non HMO) | Source: Home / Self Care | Attending: Family Medicine | Admitting: Family Medicine

## 2015-10-27 DIAGNOSIS — J069 Acute upper respiratory infection, unspecified: Secondary | ICD-10-CM

## 2015-10-27 MED ORDER — AMOXICILLIN 500 MG PO CAPS: 500.0000 mg | ORAL_CAPSULE | Freq: Three times a day (TID) | ORAL | Status: DC

## 2015-10-27 NOTE — ED Provider Notes (Signed)
CSN: [id number]     Arrival date & time 10/27/15  1543 History   First MD Initiated Contact with Patient 10/27/15 1651     Chief Complaint  Patient presents with  . Shortness of Breath   (Consider location/radiation/quality/duration/timing/severity/associated sxs/prior Treatment) HPI Patient states that she has had congestion and sensation of shortness of breath since last night. She talked to a telemetry medicine physician today and was prescribed albuterol and prednisone. She states that she took her medicine around 2:00 but continues to feel somewhat short of breath at this time. She has a niece at home with similar symptoms. Past Medical History  Diagnosis Date  . Anxiety   . SOB (shortness of breath)   . Depression   . Trigeminal neuralgia   . Bell palsy     ?  Marland Kitchen Anemia   . GERD (gastroesophageal reflux disease)   . Vitamin D deficiency    Past Surgical History  Procedure Laterality Date  . Tubal ligation    . Cesarean section      x2   Family History  Problem Relation Age of Onset  . Diabetes    . Breast cancer Maternal Aunt     2 maternal aunts and cousin  . Thyroid disease    . Thyroid disease Mother   . Stroke Maternal Grandmother   . Bell's palsy Cousin   . Breast cancer Cousin   . Heart attack     Social History  Substance Use Topics  . Smoking status: Never Smoker   . Smokeless tobacco: Never Used  . Alcohol Use: Yes     Comment: Consumes alcohol maybe one per month   OB History    No data available     Review of Systems  Allergies  Review of patient's allergies indicates no known allergies.  Home Medications   Prior to Admission medications   Medication Sig Start Date End Date Taking? Authorizing Provider  albuterol (PROVENTIL HFA;VENTOLIN HFA) 108 (90 Base) MCG/ACT inhaler Inhale into the lungs every 6 (six) hours as needed for wheezing or shortness of breath.   Yes Historical Provider, MD  omeprazole (PRILOSEC) 40 MG capsule Take 40 mg by  mouth daily.   Yes Historical Provider, MD  predniSONE (DELTASONE) 10 MG tablet  03/16/13  Yes Historical Provider, MD  predniSONE (STERAPRED UNI-PAK 21 TAB) 5 MG (21) TBPK tablet Take 5 mg by mouth daily.   Yes Historical Provider, MD  Vitamin D, Ergocalciferol, (DRISDOL) 50000 UNITS CAPS Take 50,000 Units by mouth 3 (three) times a week.   Yes Historical Provider, MD  amoxicillin (AMOXIL) 500 MG capsule Take 1 capsule (500 mg total) by mouth 3 (three) times daily. 10/27/15   Tharon Aquas, PA  citalopram (CELEXA) 20 MG tablet  01/12/13   Historical Provider, MD  clindamycin (CLEOCIN T) 1 % lotion  03/09/13   Historical Provider, MD  ferrous sulfate 325 (65 FE) MG tablet Take 325 mg by mouth daily with breakfast.    Historical Provider, MD  fish oil-omega-3 fatty acids 1000 MG capsule Take 2 g by mouth daily.    Historical Provider, MD  Gabapentin, PHN, 300 MG TABS Take 300 mg by mouth 3 (three) times daily. 03/31/13   Levert Feinstein, MD  minocycline (MINOCIN,DYNACIN) 100 MG capsule  01/14/13   Historical Provider, MD   Meds Ordered and Administered this Visit  Medications - No data to display  BP 141/81 mmHg  Pulse 127  Temp(Src) 100.1 F (37.8 C) (  Oral)  SpO2 98%  LMP 10/27/2015 No data found.   Physical Exam  Constitutional: She appears well-developed and well-nourished. No distress.  HENT:  Head: Normocephalic and atraumatic.  Right Ear: External ear normal.  Left Ear: External ear normal.  Mouth/Throat: Oropharynx is clear and moist.  Eyes: Conjunctivae are normal.  Cardiovascular: Normal rate.   Pulmonary/Chest: Effort normal and breath sounds normal.  Patient speaks in full sentences.  Nursing note and vitals reviewed.   ED Course  Procedures (including critical care time)  Labs Review Labs Reviewed - No data to display  Imaging Review No results found.   Visual Acuity Review  Right Eye Distance:   Left Eye Distance:   Bilateral Distance:    Right Eye Near:   Left  Eye Near:    Bilateral Near:         MDM   1. Acute URI    Meds ordered this encounter  Medications  . predniSONE (STERAPRED UNI-PAK 21 TAB) 5 MG (21) TBPK tablet    Sig: Take 5 mg by mouth daily.  Marland Kitchen albuterol (PROVENTIL HFA;VENTOLIN HFA) 108 (90 Base) MCG/ACT inhaler    Sig: Inhale into the lungs every 6 (six) hours as needed for wheezing or shortness of breath.  Marland Kitchen amoxicillin (AMOXIL) 500 MG capsule    Sig: Take 1 capsule (500 mg total) by mouth 3 (three) times daily.    Dispense:  21 capsule    Refill:  0    Order Specific Question:  Supervising Provider    Answer:  Linna Hoff [phone]   Continue symptomatic treatment at home including 60 mg daily of prednisone in the use of her inhaler. She is encouraged to follow up with her primary care provider. Patient had ambulatory saturations down here in the department with a drop in her sats to 88 while ambulating, but quickly returned to 100% at rest without oxygen. Instructions of care provided discharged home in stable condition.    Tharon Aquas, PA 10/27/15 1912

## 2015-10-27 NOTE — ED Notes (Signed)
Pt stated that she has sore throat and SOB Pt alert and oriented

## 2015-10-27 NOTE — Discharge Instructions (Signed)
Cough, Adult A cough helps to clear your throat and lungs. A cough may last only 2-3 weeks (acute), or it may last longer than 8 weeks (chronic). Many different things can cause a cough. A cough may be a sign of an illness or another medical condition. HOME CARE  Pay attention to any changes in your cough.  Take medicines only as told by your doctor.  If you were prescribed an antibiotic medicine, take it as told by your doctor. Do not stop taking it even if you start to feel better.  Talk with your doctor before you try using a cough medicine.  Drink enough fluid to keep your pee (urine) clear or pale yellow.  If the air is dry, use a cold steam vaporizer or humidifier in your home.  Stay away from things that make you cough at work or at home.  If your cough is worse at night, try using extra pillows to raise your head up higher while you sleep.  Do not smoke, and try not to be around smoke. If you need help quitting, ask your doctor.  Do not have caffeine.  Do not drink alcohol.  Rest as needed. GET HELP IF:  You have new problems (symptoms).  You cough up yellow fluid (pus).  Your cough does not get better after 2-3 weeks, or your cough gets worse.  Medicine does not help your cough and you are not sleeping well.  You have pain that gets worse or pain that is not helped with medicine.  You have a fever.  You are losing weight and you do not know why.  You have night sweats. GET HELP RIGHT AWAY IF:  You cough up blood.  You have trouble breathing.  Your heartbeat is very fast.   This information is not intended to replace advice given to you by your health care provider. Make sure you discuss any questions you have with your health care provider.   Document Released: 06/07/2011 Document Revised: 06/15/2015 Document Reviewed: 12/01/2014 Elsevier Interactive Patient Education 2016 Else[employer] Inc[employer]  Cough, Adult A cough helps to clear your throat and lungs. A  cough may last only 2-3 weeks (acute), or it may last longer than 8 weeks (chronic). Many different things can cause a cough. A cough may be a sign of an illness or another medical condition. HOME CARE  Pay attention to any changes in your cough.  Take medicines only as told by your doctor.  If you were prescribed an antibiotic medicine, take it as told by your doctor. Do not stop taking it even if you start to feel better.  Talk with your doctor before you try using a cough medicine.  Drink enough fluid to keep your pee (urine) clear or pale yellow.  If the air is dry, use a cold steam vaporizer or humidifier in your home.  Stay away from things that make you cough at work or at home.  If your cough is worse at night, try using extra pillows to raise your head up higher while you sleep.  Do not smoke, and try not to be around smoke. If you need help quitting, ask your doctor.  Do not have caffeine.  Do not drink alcohol.  Rest as needed. GET HELP IF:  You have new problems (symptoms).  You cough up yellow fluid (pus).  Your cough does not get better after 2-3 weeks, or your cough gets worse.  Medicine does not help your cough and you  are not sleeping well.  You have pain that gets worse or pain that is not helped with medicine.  You have a fever.  You are losing weight and you do not know why.  You have night sweats. GET HELP RIGHT AWAY IF:  You cough up blood.  You have trouble breathing.  Your heartbeat is very fast.   This information is not intended to replace advice given to you by your health care provider. Make sure you discuss any questions you have with your health care provider.   Document Released: 06/07/2011 Document Revised: 06/15/2015 Document Reviewed: 12/01/2014 Elsevier Interactive Patient Education [employer]

## 2015-11-03 ENCOUNTER — Other Ambulatory Visit: Payer: Self-pay | Admitting: Internal Medicine

## 2015-11-03 ENCOUNTER — Ambulatory Visit
Admission: RE | Admit: 2015-11-03 | Discharge: 2015-11-03 | Disposition: A | Discharge status: Home / Self Care | Payer: Managed Care, Other (non HMO) | Source: Ambulatory Visit | Attending: Internal Medicine | Admitting: Internal Medicine

## 2015-11-03 DIAGNOSIS — R0602 Shortness of breath: Secondary | ICD-10-CM

## 2015-11-03 MED ORDER — IOPAMIDOL (ISOVUE-370) INJECTION 76%
100.0000 mL | Freq: Once | INTRAVENOUS | Status: AC | PRN
  Administered 2015-11-03: 100 mL via INTRAVENOUS

## 2015-12-12 ENCOUNTER — Encounter (HOSPITAL_COMMUNITY): Payer: Managed Care, Other (non HMO)

## 2015-12-14 ENCOUNTER — Other Ambulatory Visit (HOSPITAL_COMMUNITY): Payer: Self-pay | Admitting: *Deleted

## 2015-12-15 ENCOUNTER — Ambulatory Visit (HOSPITAL_COMMUNITY)
Admission: RE | Admit: 2015-12-15 | Discharge: 2015-12-15 | Disposition: A | Discharge status: Home / Self Care | Payer: Managed Care, Other (non HMO) | Source: Ambulatory Visit | Attending: Internal Medicine | Admitting: Internal Medicine

## 2015-12-15 DIAGNOSIS — D649 Anemia, unspecified: Secondary | ICD-10-CM | POA: Diagnosis not present

## 2015-12-15 MED ORDER — SODIUM CHLORIDE 0.9 % IV SOLN
510.0000 mg | Freq: Once | INTRAVENOUS | Status: AC
  Administered 2015-12-15: 510 mg via INTRAVENOUS
  Filled 2015-12-15: qty 17

## 2018-01-24 ENCOUNTER — Telehealth: Payer: Self-pay | Admitting: Neurology

## 2018-01-24 NOTE — Telephone Encounter (Signed)
This pt has been referred back to us for facial pain. She saw Dr. Terrace ArabiaYan once back in 2014, but is requesting to switch to Dr. Anne HahnWillis. Are you both ok with this?

## 2018-01-24 NOTE — Telephone Encounter (Signed)
Okay with me 

## 2018-03-26 ENCOUNTER — Institutional Professional Consult (permissible substitution): Payer: 59 | Admitting: Neurology

## 2018-04-03 ENCOUNTER — Ambulatory Visit: Payer: 59 | Admitting: Neurology

## 2019-06-11 DIAGNOSIS — M7918 Myalgia, other site: Secondary | ICD-10-CM | POA: Insufficient documentation

## 2020-03-09 ENCOUNTER — Ambulatory Visit (INDEPENDENT_AMBULATORY_CARE_PROVIDER_SITE_OTHER): Payer: 59 | Admitting: Licensed Clinical Social Worker

## 2020-03-09 ENCOUNTER — Other Ambulatory Visit: Payer: Self-pay

## 2020-03-09 DIAGNOSIS — F4323 Adjustment disorder with mixed anxiety and depressed mood: Secondary | ICD-10-CM

## 2020-03-09 DIAGNOSIS — F32A Depression, unspecified: Secondary | ICD-10-CM | POA: Insufficient documentation

## 2020-03-09 NOTE — Progress Notes (Signed)
Virtual Visit via Telephone Note  I connected with Melissa Franklin on 03/10/20 at  3:30 PM EDT by telephone and verified that I am speaking with the correct person using two identifiers.  Location: Patient: home   Provider: [facility]   I discussed the limitations, risks, security and privacy concerns of performing an evaluation and management service by telephone and the availability of in person appointments. I also discussed with the patient that there may be a patient responsible charge related to this service. The patient expressed understanding and agreed to proceed.  I discussed the assessment and treatment plan with the patient. The patient was provided an opportunity to ask questions and all were answered. The patient agreed with the plan and demonstrated an understanding of the instructions.   The patient was advised to call back or seek an in-person evaluation if the symptoms worsen or if the condition fails to improve as anticipated.  I provided 60 minutes of non-face-to-face time during this encounter.   Steward Sames R Hattie Aguinaldo, LCSW   THERAPIST PROGRESS NOTE  Session Time: 60 min  Participation Level: Active  Behavioral Response: NAAlertAnxious  Type of Therapy: Individual Therapy  Treatment Goals addressed: Anxiety and Coping  Interventions: Strength-based and Supportive  Summary: Melissa Franklin is a 44 y.o. female who presents with anxiety associated with recent significant external stressor involving a recent family crisis with daughter. LCSW allowed pt to express thoughts and feelings associated with this incident. Discussed supportive family relationships, and the importance of reaching out for help/support in time of need. LCSW encouraged pt to focus on self-care and importance of daily routines including healthy sleep/wake cycle. LCSW provided pt with crisis numbers, if needed.    Suicidal/Homicidal: Nowithout intent/plan  Therapist Response: Pt is presenting with  fluctuating/intermittent progress due to significant external stressor  Plan: Return again in 1-2 weeks.  Diagnosis: Axis I: Depressive Disorder NOS    Axis II: No diagnosis    Ernest Haber Jager Koska, LCSW 03/09/2020

## 2020-03-23 ENCOUNTER — Ambulatory Visit (INDEPENDENT_AMBULATORY_CARE_PROVIDER_SITE_OTHER): Payer: 59 | Admitting: Licensed Clinical Social Worker

## 2020-03-23 ENCOUNTER — Other Ambulatory Visit: Payer: Self-pay

## 2020-03-23 DIAGNOSIS — F4323 Adjustment disorder with mixed anxiety and depressed mood: Secondary | ICD-10-CM | POA: Diagnosis not present

## 2020-03-23 NOTE — Patient Instructions (Signed)
Caring for Your Mental Health Mental health is emotional, psychological, and social well-being. Mental health is just as important as physical health. In fact, mental and physical health are connected, and you need both to be healthy. Some signs of good mental health (well-being) include:  Being able to attend to tasks at home, school, or work.  Being able to manage stress and emotions.  Practicing self-care, which may include: ? A regular exercise pattern. ? A reasonably healthy diet. ? Supportive and trusting relationships. ? The ability to relax and calm yourself (self-calm).  Having pleasurable hobbies and activities to do.  Believing that you have meaning and purpose in your life.  Recovering and adjusting after facing challenges (resilience). You can take steps to build or strengthen these mentally healthy behaviors. There are resources and support to help you with this. Why is caring for mental health important? Caring for your mental health is a big part of staying healthy. Everyone has times when feelings, thoughts, or situations feel overwhelming. Mental health means having the skills to manage what feels overwhelming. If this sense of being overwhelmed persists, however, you might need some help. If you have some of the following signs, you may need to take better care of your mental health or seek help from a health care provider or mental health professional:  Problems with energy or focus.  Changes in eating habits.  Problems sleeping, such as sleeping too much or not enough.  Emotional distress, such as anger, sadness, depression, or anxiety.  Major changes in your relationships.  Losing interest in life or activities that you used to enjoy. If you have any of these symptoms on most days for 2 weeks or longer:  Talk with a close friend or family member about how you are feeling.  Contact your health care provider to discuss your symptoms.  Consider working with a  mental health professional. Your health care provider, family, or friends may be able to recommend a therapist. What can I do to promote emotional and mental health? Managing emotions  Learn to identify emotions and deal with them. Recognizing your emotions is the first step in learning to deal with them.  Practice ways to appropriately express feelings. Remember that you can control your feelings. They do not control you.  Practice stress management techniques, such as: ? Relaxation techniques, like breathing or muscle relaxation exercises. ? Exercise. Regular activity can lower your stress level. ? Changing what you can change and accepting what you cannot change.  Build up your resilience so that you can recover and adjust after big problems or challenges. Practice resilient behaviors and attitudes: ? Set and focus on long-term goals. ? Develop and maintain healthy, supportive relationships. ? Learn to accept change and make the best of the situation. ? Take care of yourself physically by eating a healthy diet, getting plenty of sleep, and exercising regularly. ? Develop self-awareness. Ask others to give feedback about how they see you. ? Practice mindfulness meditation to help you stay calm when dealing with daily challenges. ? Learn to respond to situations in healthy ways, rather than reacting with your emotions. ? Keep a positive attitude, and believe in yourself. Your view of yourself affects your mental health. ? Develop your listening and empathy skills. These will help you deal with difficult situations and communications.  Remember that emotions can be used as a good source of communication and are a great source of energy. Try to laugh and find humor in life.   Sleeping  Get the right amount and quality of sleep. Sleep has a big impact on physical and mental health. To improve your sleep: ? Go to bed and wake up around the same time every day. ? Limit screen time before  bedtime. This includes the use of your cell phone, TV, computer, and tablet. ? Keep your bedroom dark and cool. Activity   Exercise or do some physical activity regularly. This helps: ? Keep your body strong, especially during times of stress. ? Get rid of chemicals in your body (hormones) that build up when you are stressed. ? Build up your resilience. Eating and drinking   Eat a healthy diet that includes whole grains, vegetables, fresh fruits, and lean proteins. If you have questions about what foods are best for you, ask your health care provider.  Try not to turn to sweet, salty, or otherwise unhealthy foods when you are tired or unhappy. This can lead to unwanted weight gain and is not a healthy way to cope with emotions. Where to find more information You can find more information about how to care for your mental health from:  National Alliance on Mental Illness (NAMI): www.nami.org  National Institute of Mental Health: www.nimh.nih.gov  Centers for Disease Control and Prevention: www.cdc.gov/hrqol/wellbeing.htm Contact a health care provider if:  You lose interest in being with others or you do not want to leave the house.  You have a hard time completing your normal activities or you have less energy than normal.  You cannot stay focused or you have problems with memory.  You feel that your senses are heightened, and this makes you upset or concerned.  You feel nervous or have rapid mood changes.  You are sleeping or eating more or less than normal.  You question reality or you show odd behavior that disturbs you or others. Get help right away if:  You have thoughts about hurting yourself or others. If you ever feel like you may hurt yourself or others, or have thoughts about taking your own life, get help right away. You can go to your nearest emergency department or call:  Your local emergency services (911 in the [place]  A suicide crisis helpline, such as the  National Suicide Prevention Lifeline at 1-800-273-8255. This is open 24 hours a day. Summary  Mental health is not just the absence of mental illness. It involves understanding your emotions and behaviors, and taking steps to cope with them in a healthy way.  If you have symptoms of mental or emotional distress, get help from family, friends, a health care provider, or a mental health professional.  Practice good mental health behaviors such as stress management skills, self-calming skills, exercise, and healthy sleeping and eating. This information is not intended to replace advice given to you by your health care provider. Make sure you discuss any questions you have with your health care provider. Document Revised: 09/06/2017 Document Reviewed: 02/05/2017 Elsevier Patient Education  2020 Elsevier Inc.  

## 2020-03-23 NOTE — Progress Notes (Signed)
Virtual Visit via Telephone Note  I connected with Melissa Franklin on 03/23/20 at 12:30 PM EDT by telephone and verified that I am speaking with the correct person using two identifiers. Due to confidentiality concerns, pt prefers telephone session until in-person visits allowed.  Location: Patient: home Provider: [facility]   I discussed the limitations, risks, security and privacy concerns of performing an evaluation and management service by telephone and the availability of in person appointments. I also discussed with the patient that there may be a patient responsible charge related to this service. The patient expressed understanding and agreed to proceed.  I discussed the assessment and treatment plan with the patient. The patient was provided an opportunity to ask questions and all were answered. The patient agreed with the plan and demonstrated an understanding of the instructions.   The patient was advised to call back or seek an in-person evaluation if the symptoms worsen or if the condition fails to improve as anticipated.  I provided 60 minutes of non-face-to-face time during this encounter.   Melissa Franklin Melissa Melissa Kitzmiller, LCSW    THERAPIST PROGRESS NOTE  Session Time: 60 min  Participation Level: Active  Behavioral Response: NeatAlertAnxious  Type of Therapy: Individual Therapy  Treatment Goals addressed: Anxiety and Coping  Interventions: Solution Focused and Supportive  Summary: Melissa Franklin is a 44 y.o. female who presents with continuing depression and anxiety. Melissa Franklin reports that life has stablized since last session--daughter is back home and is feeling more supported. Continued exploration of family relationships and friendships and discussed overall psychological impact of relationships on pt. Allowed pt to explore what makes her feel needed and wanted as a wife, sister, daughter, mother, and friend. Reviewed importance of stress management in a reported high-stress time  of life "I feel so overwhelmed".  Encouraged self care, activity, and social support.   Suicidal/Homicidal: No  Therapist Response: [name] continues to make good progress towards goals of emotion regulation and crisis management. Pt feels continued work is needed with communication skills and addressing her own needs to friends and family members.  Plan: Return again in 3 weeks.  Diagnosis: Axis I: Depressive Disorder NOS    Axis II: No diagnosis    Ernest Haber Melissa Monk, LCSW 03/23/2020

## 2020-04-13 ENCOUNTER — Other Ambulatory Visit: Payer: Self-pay

## 2020-04-13 ENCOUNTER — Ambulatory Visit (INDEPENDENT_AMBULATORY_CARE_PROVIDER_SITE_OTHER): Payer: 59 | Admitting: Licensed Clinical Social Worker

## 2020-04-13 DIAGNOSIS — F4323 Adjustment disorder with mixed anxiety and depressed mood: Secondary | ICD-10-CM | POA: Diagnosis not present

## 2020-04-13 NOTE — Patient Instructions (Signed)
Supporting Someone With Anxiety Anxiety disorders are mental health conditions that cause overwhelming feelings of nervousness or worry. These feelings interfere with daily activities and relationships. Anxiety disorders include:  Generalized anxiety disorder (GAD).  Social anxiety.  Post-traumatic stress disorder (PTSD). When a person has an anxiety disorder, his or her condition can affect others around him or her, such as friends and family members. Friends and family can help by offering support and understanding. What do I need to know about this condition? Anxiety is the mental and physical experience of nervousness or worry that you might feel when you think about a stressful event. Occasional anxiety is normal, but a person with an anxiety disorder becomes preoccupied with this worry. He or she may know that the anxiety is not logical, but knowing this does not relieve the discomfort that he or she feels. Anxiety disorders cause a great deal of distress and prevent someone from having a normal daily life. Someone with an anxiety disorder may:  Experience anxiety that: ? May or may not have a specific trigger. ? Lasts for long periods of time. ? Causes physical problems over time. ? Is far more intense than normal anticipation. ? Occurs at unpredictable times.  Feel restless or edgy.  Get fatigued easily.  Have trouble focusing.  Have muscle tension.  Have trouble falling asleep or staying asleep.  Be irritable and occasionally have sudden expressions of strong feelings (outbursts).  Have worries that do not make sense to you. What do I need to know about the treatment options? Anxiety disorders are generally very treatable by mental health providers such as psychologists, psychiatrists, and clinical social workers. Treatment may include one or more of the following:  Psychotherapy, also called talk therapy or counseling. Types of psychotherapy that are used to treat anxiety  include: ? Cognitive behavioral therapy (CBT). This type of therapy teaches a person how to recognize unhealthy feelings, thoughts, and behaviors, and how to replace those feelings with positive thoughts and actions. ? Behavior therapy that trains a person to relax and self-soothe. This also involves gradually exposing the person to the cause of the anxiety (progressive exposure therapy). ? Biofeedback. This type of therapy focuses on trying to control certain body functions, like heart rate, to lessen the physical impact of anxiety. ? Mindfulness-based stress reduction training. This uses education, meditation, and yoga to help a person stay focused on the present instead of living in the past or worrying about the future. ? Acceptance and commitment therapy (ACT). This helps a person to focus on acceptance, rather than trying to control every situation. ? Family therapy. This treatment helps family members to communicate and deal with conflict in healthy ways.  Medicine to treat anxiety and help to control certain emotions and behaviors.  Mind-body programs. These programs encourage the person with anxiety to be involved in his or her treatment and feel empowered. Mind-body programs may include mindfulness-based stress reduction training, yoga, or tai chi. How can I create a safe environment?  For certain types of anxiety, such as PTSD, you may want to: ? Remove alcohol and prescription medicines from your loved one's home, or limit the amount of these substances in the home. This can help to prevent your loved one from abusing alcohol and prescription medicines. ? Remove or lock up guns and other weapons. If you do not have a safe place to keep a gun, local law enforcement may store a gun for you.  Make a written crisis plan.  Include important phone numbers, such as the local crisis intervention team. Make sure that: ? The person with anxiety knows about this plan and agrees with it. ? Everyone  who has regular contact with the person knows about the plan and knows what to do in an emergency. ? The written plan is easily accessible and can be quickly put into action. How should I care for myself? It is important to find ways to care for your body, mind, and well-being while supporting someone with anxiety.  Try to maintain your normal routines. This can help you remember that your life is about more than your loved one's condition.  Understand what your limits are. Say "no" to requests or events that lead to a schedule that is too busy.  Make time for activities that help you relax, and try to not feel guilty about taking time for yourself.  Spend time with friends and family.  Consider trying meditation and deep breathing exercises to lower your stress. Attend some mind-body classes by yourself or with your loved one.  Get plenty of sleep.  Exercise, even if it is just taking a short walk a few times a week.  If you are struggling emotionally with guilt, fear, or anger, consider working with a therapist. What are some signs that the condition is getting worse? Signs that your loved one's condition may be getting worse include:  Dramatic mood swings.  Staying away from activities that he or she used to enjoy.  Drinking more alcohol than normal.  Either seeming tearful or seeming to lack emotion.  Talking about "not feeling right."  Staying away from others (isolating himself or herself). Where to find support Talk about the condition Good communication is the key to supporting your friend or family member. Here are a few things to keep in mind:  Be careful about too much prodding. Try not to overdo reminders to an adult friend or family member about things like taking medicines. Ask how your loved one prefers that you help.  Ask questions and then listen to your loved one's response. Be available if your friend or family member wants to talk, but give your loved one  space if he or she does not feel like talking.  Never ignore comments about suicide, and do not try to avoid the subject of suicide. Talking about suicide will not make your loved one want to act on it. You or your loved one can reach out 24 hours a day to get free, private support (on the phone or a live online chat) from a suicide crisis helpline, such as the [place] at (240)637-5617.  Be encouraging and offer emotional support. This can help to lower stress. Even saying something simple to comfort your loved one may help.  If your loved one is open to it, go with him or her to visits with a counselor or health care provider. Get suggestions directly from your loved one's care providers about when to get help if you are concerned about behavior changes. Privacy laws limit how much a person's health care provider can share with you without your loved one's permission, but if you feel that a situation is an emergency, do not wait to call a health care provider or emergency services. Find support and resources  Consider joining self-help and support groups, not only for your friend or family member, but also for yourself. People in these peer and family support groups understand what you and your  loved one are going through. They can help you feel a sense of hope and connect you with local resources to help you learn more. ? You may also consider family therapy. General support  Make an effort to learn all you can about your loved one's form of anxiety.  Include your loved one in activities. Invite him or her to go for walks and outings.  Help your loved one follow his or her treatment plan as directed by health care providers. This could mean driving him or her to therapy sessions or suggesting ways to cope with stress.  Remember that your support really matters. Social support is a huge benefit for someone who is coping with anxiety. Where to find more  information A health care provider may be able to recommend mental health resources that are available online or over the phone. You could start with:  Government sites such as the Substance Abuse and [place] (SAMHSA): ktimeonline.com  National mental health organizations such as the [employer] [facility] [place] ([place]): www.nami.org Get help right away if:  Your loved one expresses thoughts about harming himself or herself or others.  Your loved one's behavior becomes hard to predict (erratic).  Your loved one shows behavior that does not make sense with the current time, place, or circumstances. These behaviors may include seeing, feeling, tasting, or hearing things that are not real (hallucinations) or having flashbacks. If you ever feel like your loved one may hurt himself or herself or others, or may have thoughts about taking his or her own life, get help right away. You can go to your nearest emergency department or call:  Your local emergency services (911 in the [place]  A suicide crisis helpline, such as the [place] at 910-809-2274. This is open 24 hours a day. Summary  People with anxiety disorders experience overwhelming feelings of nervousness or worry. Friends and family can help by offering support and understanding.  Anxiety disorders are generally very treatable by mental health providers. They can be treated with psychotherapy (also known as talk therapy), behavior therapy, medicine, and mind-body programs.  Be compassionate and listen to your loved one. Be available if your friend or family member wants to talk, but give your loved one space if he or she does not feel like talking.  Find ways to care for your own body, mind, and well-being while supporting someone with anxiety. Try to maintain your normal routines and make time to do things that you enjoy. This information is not intended to replace advice  given to you by your health care provider. Make sure you discuss any questions you have with your health care provider. Document Revised: 01/15/2019 Document Reviewed: 02/05/2017 Elsevier Patient Education  [place] With Depression Depression is a mental health condition that affects the way a person feels, thinks, and handles daily activities such as eating, sleeping, and working. When a person has depression, his or her condition can affect others around him or her, such as friends and family members. Friends and family can help by offering support and understanding. What do I need to know about this condition? The main symptoms of depression are:  Constant depressed or irritable mood.  Loss of interest in things and activities that were enjoyed in the past. Other symptoms of depression include:  Fatigue.  Sleeping too much or too little.  Difficulty falling asleep, or waking up early and not being able to get  back to sleep.  Difficulty concentrating or making decisions.  Changes in appetite and weight.  Staying away from others (isolating oneself).  Expressing feelings of guilt.  Expressing suicidal thoughts or feelings. What do I need to know about the treatment options? This condition is usually treated by mental health providers such as psychologists, psychiatrists, and clinical social workers. Treatment may include one or more of the following:  Psychotherapy, also called talk therapy or counseling. Types of psychotherapy include individual or group therapy, and they usually involve the following approaches: ? Cognitive behavioral therapy (CBT). This type of therapy teaches a person how to recognize feelings, thoughts, and behaviors that contribute to depression. The person is taught to make a choice about how to respond to these feelings, thoughts, and behaviors so that he or she can experience fewer symptoms. ? Interpersonal therapy (IPT).  This helps to improve the way that someone with depression relates to and communicates with others. This type of therapy may involve caregivers and loved ones. ? Family therapy. This treatment helps family members to communicate and deal with conflict in healthy ways.  Medicine. This is often used to help with certain emotions and behaviors. Combining medicine and therapy is often the most effective approach. The following lifestyle changes may also help with managing symptoms of depression:  Limiting alcohol and drug use.  Exercising regularly.  Getting enough good-quality sleep.  Making healthy eating choices.  Reducing distressing situations.  Spending time outside.  Following regular daily routines. How can I support my loved one? Talk about the condition Good communication is the key to supporting your friend or family member. Here are a few things to keep in mind:  Ask your loved one how you can support him or her.  Respect your loved one's right to make decisions.  Be careful about too much prodding. Try not to overdo reminders to an adult friend or family member about things like taking medicines. Ask how your loved one prefers that you help.  Be encouraging and offer emotional support. This can help to lower stress. Even saying something simple to comfort your loved one may help.  Never ignore comments about suicide, and do not try to avoid the subject of suicide. Talking about suicide will not make your loved one want to act on it. You or your loved one can reach out 24 hours a day to get free, private support (on the phone or a live online chat) from a suicide crisis helpline, such as the [facility] at (418)139-0594.  Listening is very important. Be available if your friend or family member wants to talk. Make an effort to acknowledge his or her feelings and stay calm and realistic. Find support and resources A health care provider may be able  to recommend mental health resources that are available online or over the phone. You could start with:  Government sites such as the Substance Abuse and [place] (SAMHSA): ktimeonline.com  National mental health organizations such as the [employer] [address] ([place]): www.nami.org You may also consider:  Joining self-help and support groups, not only for your friend or family member, but also for yourself. People in these peer and family support groups understand what you and your loved one are going through. They can help you feel a sense of hope and connect you with local resources to help you learn more.  Attending family therapy with your loved one. General support  Make an effort to  learn all you can about depression.  Help your loved one follow his or her treatment plan as directed by health care providers. This could mean driving him or her to therapy sessions or suggesting ways to cope with stress.  Ask your loved one if you may join him or her for a therapy session or go with him or her to health care visits. Joining your loved one with his or her permission can give you an opportunity to learn how to be more supportive.  Include your loved one in activities. Invite her or him to go for walks and outings. At first, your loved one may not want to, but keep trying.  Be patient and do not expect your loved one to do too much too soon.  Help with daily responsibilities, such as laundry or meals. Sometimes daily tasks seem overwhelming to a person with depression.  Remember that your support really matters. Social support is a huge benefit for someone who is coping with depression. How can I create a safe environment? If your loved one feels unable to control his or her behavior, it may be necessary to take steps to keep his or her home safe. Such steps may include:  Locking up alcohol and prescription pills that your loved one may turn to.  Count prescription pills often. You may want to consider removing alcohol from the home.  Removing or locking up guns and other weapons. If you do not have a safe place to keep a gun, local law enforcement may store a gun for you.  Making a written crisis plan. Include important phone numbers, such as the local crisis intervention team. Make sure that: ? The person with depression knows about this plan. ? Everyone who has regular contact with that person knows about the plan and knows what to do in an emergency. How should I care for myself? It is important to find ways to care for your body, mind, and well-being while supporting someone with depression.  Spend time with friends and family. Find someone you can talk to who will also help you work on using coping skills to manage stress. Consider seeking therapy for yourself.  Try to maintain your normal routines. This can help you remember that your life is about more than your loved one's condition.  Understand what your limits are. Say "no" to requests or events that lead to a schedule that is too busy.  Make time for activities that help you relax, and try to not feel guilty about taking time for yourself.  Consider trying meditation and deep breathing exercises to lower stress.  Get plenty of sleep.  Exercise, even if it is just taking a short walk a few times a week. What are some signs that the condition is getting worse? Signs that your loved one's condition may be getting worse include:  Symptoms returning or getting worse.  Not taking medicines or attending therapy as prescribed.  Having more trouble sleeping or doing everyday activities.  Withdrawal from friends and family. Get help right away if:  Your loved one expresses serious thoughts about self-harm or about hurting others.  Your loved one sees, hears, tastes, smells, or feels things that are not present (hallucinations). If you ever feel like your loved one may  hurt himself or herself or others, or may have thoughts about taking his or her own life, get help right away. You can go to your nearest emergency department or call:  Your local emergency  services (911 in the [place]  A suicide crisis helpline, such as the [facility] at 2506802520. This is open 24 hours a day. Summary  Depression is a mood disorder that affects the way a person feels, thinks, and handles daily activities.  Depression is usually treated by mental health professionals. It may include psychotherapy, medicine, lifestyle changes, or a combination of these approaches.  When you support a loved one with depression, it is important to keep yourself healthy and safe.  Get help right away if your loved one expresses serious thoughts about self-harm. This information is not intended to replace advice given to you by your health care provider. Make sure you discuss any questions you have with your health care provider. Document Revised: 01/15/2019 Document Reviewed: 02/05/2017 Elsevier Patient Education  [place] With Anxiety Anxiety disorders are mental health conditions that cause overwhelming feelings of nervousness or worry. These feelings interfere with daily activities and relationships. Anxiety disorders include:  Generalized anxiety disorder (GAD).  Social anxiety.  Post-traumatic stress disorder (PTSD). When a person has an anxiety disorder, his or her condition can affect others around him or her, such as friends and family members. Friends and family can help by offering support and understanding. What do I need to know about this condition? Anxiety is the mental and physical experience of nervousness or worry that you might feel when you think about a stressful event. Occasional anxiety is normal, but a person with an anxiety disorder becomes preoccupied with this worry. He or she may know that the anxiety is  not logical, but knowing this does not relieve the discomfort that he or she feels. Anxiety disorders cause a great deal of distress and prevent someone from having a normal daily life. Someone with an anxiety disorder may:  Experience anxiety that: ? May or may not have a specific trigger. ? Lasts for long periods of time. ? Causes physical problems over time. ? Is far more intense than normal anticipation. ? Occurs at unpredictable times.  Feel restless or edgy.  Get fatigued easily.  Have trouble focusing.  Have muscle tension.  Have trouble falling asleep or staying asleep.  Be irritable and occasionally have sudden expressions of strong feelings (outbursts).  Have worries that do not make sense to you. What do I need to know about the treatment options? Anxiety disorders are generally very treatable by mental health providers such as psychologists, psychiatrists, and clinical social workers. Treatment may include one or more of the following:  Psychotherapy, also called talk therapy or counseling. Types of psychotherapy that are used to treat anxiety include: ? Cognitive behavioral therapy (CBT). This type of therapy teaches a person how to recognize unhealthy feelings, thoughts, and behaviors, and how to replace those feelings with positive thoughts and actions. ? Behavior therapy that trains a person to relax and self-soothe. This also involves gradually exposing the person to the cause of the anxiety (progressive exposure therapy). ? Biofeedback. This type of therapy focuses on trying to control certain body functions, like heart rate, to lessen the physical impact of anxiety. ? Mindfulness-based stress reduction training. This uses education, meditation, and yoga to help a person stay focused on the present instead of living in the past or worrying about the future. ? Acceptance and commitment therapy (ACT). This helps a person to focus on acceptance, rather than trying to  control every situation. ? Family therapy. This treatment helps family members to communicate and  deal with conflict in healthy ways.  Medicine to treat anxiety and help to control certain emotions and behaviors.  Mind-body programs. These programs encourage the person with anxiety to be involved in his or her treatment and feel empowered. Mind-body programs may include mindfulness-based stress reduction training, yoga, or tai chi. How can I create a safe environment?  For certain types of anxiety, such as PTSD, you may want to: ? Remove alcohol and prescription medicines from your loved one's home, or limit the amount of these substances in the home. This can help to prevent your loved one from abusing alcohol and prescription medicines. ? Remove or lock up guns and other weapons. If you do not have a safe place to keep a gun, local law enforcement may store a gun for you.  Make a written crisis plan. Include important phone numbers, such as the local crisis intervention team. Make sure that: ? The person with anxiety knows about this plan and agrees with it. ? Everyone who has regular contact with the person knows about the plan and knows what to do in an emergency. ? The written plan is easily accessible and can be quickly put into action. How should I care for myself? It is important to find ways to care for your body, mind, and well-being while supporting someone with anxiety.  Try to maintain your normal routines. This can help you remember that your life is about more than your loved one's condition.  Understand what your limits are. Say "no" to requests or events that lead to a schedule that is too busy.  Make time for activities that help you relax, and try to not feel guilty about taking time for yourself.  Spend time with friends and family.  Consider trying meditation and deep breathing exercises to lower your stress. Attend some mind-body classes by yourself or with your loved  one.  Get plenty of sleep.  Exercise, even if it is just taking a short walk a few times a week.  If you are struggling emotionally with guilt, fear, or anger, consider working with a therapist. What are some signs that the condition is getting worse? Signs that your loved one's condition may be getting worse include:  Dramatic mood swings.  Staying away from activities that he or she used to enjoy.  Drinking more alcohol than normal.  Either seeming tearful or seeming to lack emotion.  Talking about "not feeling right."  Staying away from others (isolating himself or herself). Where to find support Talk about the condition Good communication is the key to supporting your friend or family member. Here are a few things to keep in mind:  Be careful about too much prodding. Try not to overdo reminders to an adult friend or family member about things like taking medicines. Ask how your loved one prefers that you help.  Ask questions and then listen to your loved one's response. Be available if your friend or family member wants to talk, but give your loved one space if he or she does not feel like talking.  Never ignore comments about suicide, and do not try to avoid the subject of suicide. Talking about suicide will not make your loved one want to act on it. You or your loved one can reach out 24 hours a day to get free, private support (on the phone or a live online chat) from a suicide crisis helpline, such as the [facility] at 959-262-1899.  Be encouraging  and offer emotional support. This can help to lower stress. Even saying something simple to comfort your loved one may help.  If your loved one is open to it, go with him or her to visits with a counselor or health care provider. Get suggestions directly from your loved one's care providers about when to get help if you are concerned about behavior changes. Privacy laws limit how much a person's health  care provider can share with you without your loved one's permission, but if you feel that a situation is an emergency, do not wait to call a health care provider or emergency services. Find support and resources  Consider joining self-help and support groups, not only for your friend or family member, but also for yourself. People in these peer and family support groups understand what you and your loved one are going through. They can help you feel a sense of hope and connect you with local resources to help you learn more. ? You may also consider family therapy. General support  Make an effort to learn all you can about your loved one's form of anxiety.  Include your loved one in activities. Invite him or her to go for walks and outings.  Help your loved one follow his or her treatment plan as directed by health care providers. This could mean driving him or her to therapy sessions or suggesting ways to cope with stress.  Remember that your support really matters. Social support is a huge benefit for someone who is coping with anxiety. Where to find more information A health care provider may be able to recommend mental health resources that are available online or over the phone. You could start with:  Government sites such as the Substance Abuse and [place] (SAMHSA): ktimeonline.com  National mental health organizations such as the [employer] on [place]): www.nami.org Get help right away if:  Your loved one expresses thoughts about harming himself or herself or others.  Your loved one's behavior becomes hard to predict (erratic).  Your loved one shows behavior that does not make sense with the current time, place, or circumstances. These behaviors may include seeing, feeling, tasting, or hearing things that are not real (hallucinations) or having flashbacks. If you ever feel like your loved one may hurt himself or herself or others, or  may have thoughts about taking his or her own life, get help right away. You can go to your nearest emergency department or call:  Your local emergency services (911 in the [place]  A suicide crisis helpline, such as the [facility] at 339-175-1903. This is open 24 hours a day. Summary  People with anxiety disorders experience overwhelming feelings of nervousness or worry. Friends and family can help by offering support and understanding.  Anxiety disorders are generally very treatable by mental health providers. They can be treated with psychotherapy (also known as talk therapy), behavior therapy, medicine, and mind-body programs.  Be compassionate and listen to your loved one. Be available if your friend or family member wants to talk, but give your loved one space if he or she does not feel like talking.  Find ways to care for your own body, mind, and well-being while supporting someone with anxiety. Try to maintain your normal routines and make time to do things that you enjoy. This information is not intended to replace advice given to you by your health care provider. Make sure you discuss any questions you have  with your health care provider. Document Revised: 01/15/2019 Document Reviewed: 02/05/2017 Elsevier Patient Education  [place].

## 2020-04-13 NOTE — Progress Notes (Signed)
Virtual Visit via Telephone Note  I connected with Melissa Franklin on 04/13/20 at 12:30 PM EDT by telephone and verified that I am speaking with the correct person using two identifiers.  Location: Patient: home Provider: [facility]   I discussed the limitations, risks, security and privacy concerns of performing an evaluation and management service by telephone and the availability of in person appointments. I also discussed with the patient that there may be a patient responsible charge related to this service. The patient expressed understanding and agreed to proceed.   The patient was advised to call back or seek an in-person evaluation if the symptoms worsen or if the condition fails to improve as anticipated.  I provided 60 minutes of non-face-to-face time during this encounter.   Louise Rawson R Tonique Mendonca, LCSW   THERAPIST PROGRESS NOTE  Session Time: 12:30-1:30 pm  Participation Level: Active  Behavioral Response: NAAlertAnxious  Type of Therapy: Individual Therapy  Treatment Goals addressed: Anxiety and Coping  Interventions: Solution Focused and Supportive  Summary: [name] is a 44 y.o. female who presents with continuing family-related stress.  Allowed pt to express thoughts and feelings continuing about daughter and daughter's behavioral health struggles.  Allowed pt to explore overall psychological impact of being primary caregiver for daughter.   Discussed relationship with best friend and several communication "flaws" that were triggers. Allowed pt to weigh out pros and cons of communicating thoughts to friend versus letting it go.    Reviewed coping skills and importance of overall self-care and life balance.    Suicidal/Homicidal: No  Therapist Response: Developments continue with overall re-working of traumatic situations and recognizing psychological impact. Pt is progressing towards overall goals. LCSW and pt will continue working together to maintain progress  and develop coping skills.   Plan: Return again in 2 weeks.  Diagnosis: Axis I: Adjustment Disorder with Mixed Emotional Features    Axis II: No diagnosis    Ernest Haber Undine Nealis, LCSW 04/13/2020

## 2020-04-26 ENCOUNTER — Other Ambulatory Visit: Payer: Self-pay

## 2020-04-26 ENCOUNTER — Ambulatory Visit (INDEPENDENT_AMBULATORY_CARE_PROVIDER_SITE_OTHER): Payer: 59 | Admitting: Licensed Clinical Social Worker

## 2020-04-26 DIAGNOSIS — F4323 Adjustment disorder with mixed anxiety and depressed mood: Secondary | ICD-10-CM

## 2020-04-26 NOTE — Progress Notes (Signed)
Virtual Visit via Telephone Note  I connected with SAESHA LLERENAS on 04/26/20 at 10:00 AM EDT by telephone and verified that I am speaking with the correct person using two identifiers.  Location: Patient: home Provider: [facility]   I discussed the limitations, risks, security and privacy concerns of performing an evaluation and management service by telephone and the availability of in person appointments. I also discussed with the patient that there may be a patient responsible charge related to this service. The patient expressed understanding and agreed to proceed.   The patient was advised to call back or seek an in-person evaluation if the symptoms worsen or if the condition fails to improve as anticipated.  I provided 45 minutes of non-face-to-face time during this encounter.   Yocelyn Brocious R Ebin Palazzi, LCSW   THERAPIST PROGRESS NOTE  Session Time: 10:00-10:45 am  Participation Level: Active  Behavioral Response: NAAlertAnxious  Type of Therapy: Individual Therapy  Treatment Goals addressed: Anxiety and Coping  Interventions: CBT and Supportive  Summary: KAYLEY ZEIDERS is a 44 y.o. female who presents with improving overall stress/anxiety.  Pt reports some situational stress involving daughter and daughter's care. Explored relationships with family members, and coworkers. Allowed pt to explore pros and cons of current work environment.  Discussed ways of setting boundaries with colleagues and family members.    Suicidal/Homicidal: No  Therapist Response: [place]  Plan: Return again in 4 weeks. Ongoing treatment plan includes maintaining current levels of progress and continuing to focus on setting boundaries, mood management, and stress/anxiety management.  Diagnosis: Axis I: Adjustment Disorder with Mixed Emotional Features    Axis II: No diagnosis    Ernest Haber Johnsie Moscoso, LCSW 04/26/2020

## 2020-05-31 ENCOUNTER — Ambulatory Visit (INDEPENDENT_AMBULATORY_CARE_PROVIDER_SITE_OTHER): Payer: 59 | Admitting: Licensed Clinical Social Worker

## 2020-05-31 ENCOUNTER — Other Ambulatory Visit: Payer: Self-pay

## 2020-05-31 DIAGNOSIS — F4323 Adjustment disorder with mixed anxiety and depressed mood: Secondary | ICD-10-CM

## 2020-05-31 NOTE — Progress Notes (Signed)
Virtual Visit via Telephone Note  I connected with Melissa Franklin on 05/31/20 at 10:00 AM EDT by telephone and verified that I am speaking with the correct person using two identifiers.  Location: Patient: home Provider: [facility]   I discussed the limitations, risks, security and privacy concerns of performing an evaluation and management service by telephone and the availability of in person appointments. I also discussed with the patient that there may be a patient responsible charge related to this service. The patient expressed understanding and agreed to proceed.   The patient was advised to call back or seek an in-person evaluation if the symptoms worsen or if the condition fails to improve as anticipated.  I provided 60 minutes of non-face-to-face time during this encounter.   Abisai Deer R Craigory Toste, LCSW    THERAPIST PROGRESS NOTE  Session Time: 10:00-11:00 am  Participation Level: Active  Behavioral Response: NAAlertAnxious  Type of Therapy: Individual Therapy  Treatment Goals addressed: Anxiety and Coping  Interventions: Supportive  Summary: Melissa Franklin is a 44 y.o. female who presents with symptoms triggered by current external stressors.  Allowed pt to explore and express thoughts and feelings about current stressors: daughter's mental health, relationship with husband, and relationship with best friend.  Allowed pt to explore relationship with best friend in depth--particularly centered around incidents that happened around Christmas of last year.  Discussed personal growth and allowed pt to reflect on life overall and recent situations that have made her recognize how she has expanded her own personal growth.   Suicidal/Homicidal: No  Therapist Response: T[place]iney actively engaged throughout session.  Plan: Return again in 4 weeks. Ongoing treatment plan to include continued support with patient addressing problematic coping strategies and mechanisms and improve  emotion identification and regulation.   Diagnosis: Axis I: Adjustment Disorder with Mixed Emotional Features    Axis II: No diagnosis    Ernest Haber Jaymes Hang, LCSW 05/31/2020

## 2020-06-15 ENCOUNTER — Other Ambulatory Visit: Payer: Self-pay | Admitting: Internal Medicine

## 2020-06-15 DIAGNOSIS — M5416 Radiculopathy, lumbar region: Secondary | ICD-10-CM

## 2020-06-28 ENCOUNTER — Ambulatory Visit (INDEPENDENT_AMBULATORY_CARE_PROVIDER_SITE_OTHER): Payer: 59 | Admitting: Licensed Clinical Social Worker

## 2020-06-28 ENCOUNTER — Other Ambulatory Visit: Payer: Self-pay

## 2020-06-28 DIAGNOSIS — F4323 Adjustment disorder with mixed anxiety and depressed mood: Secondary | ICD-10-CM | POA: Diagnosis not present

## 2020-06-28 NOTE — Progress Notes (Signed)
Virtual Visit via Telephone Note  I connected with Melissa Franklin on 06/28/20 at 10:00 AM EDT by telephone and verified that I am speaking with the correct person using two identifiers.  Location: Patient: home Provider: [facility]   I discussed the limitations, risks, security and privacy concerns of performing an evaluation and management service by telephone and the availability of in person appointments. I also discussed with the patient that there may be a patient responsible charge related to this service. The patient expressed understanding and agreed to proceed.  The patient was advised to call back or seek an in-person evaluation if the symptoms worsen or if the condition fails to improve as anticipated.  I provided 60 minutes of non-face-to-face time during this encounter.   Lain Tetterton R Telly Jawad, LCSW   THERAPIST PROGRESS NOTE  Session Time: 10:00-11:00  Participation Level: Active  Behavioral Response: NAAlertAnxious  Type of Therapy: Individual Therapy  Treatment Goals addressed: Anxiety and Coping  Interventions: Supportive and Family Systems  Summary: Melissa Franklin is a 44 y.o. female who presents with continuing symptoms related to her diagnosis (adjustment disorder). Focused session today around pts feelings about daughter and how she can best support daughter as a parent. Discussed daughters mental health and motivating factors.   Discussed possibility of pt bringing daughter in on session. Discussed expectations and limitations of doing so.   Explored pts current levels of health--pt reports that she is having severe leg/hip/back pain and cannot walk far distances. Pt feels more dependent on children to get chores done around the house.   Suicidal/Homicidal: No  SI, HI, or AVH reported at time of session.  Therapist Response: Melissa Franklin reports that mood is stable and that she feels anxiety/stress levels are fluctuating situationally but that she is managing it well.  This is reflective of overall progress.   Plan: Return again in 3 weeks. Ongoing treatment plan to include mood management, anxiety management, and self care/life balance.   Diagnosis: Axis I: Adjustment Disorder with Mixed Emotional Features    Axis II: No diagnosis    Ernest Haber Mardell Cragg, LCSW 06/28/2020

## 2020-07-07 ENCOUNTER — Ambulatory Visit
Admission: RE | Admit: 2020-07-07 | Discharge: 2020-07-07 | Disposition: A | Discharge status: Home / Self Care | Payer: 59 | Source: Ambulatory Visit | Attending: Internal Medicine | Admitting: Internal Medicine

## 2020-07-07 DIAGNOSIS — M5416 Radiculopathy, lumbar region: Secondary | ICD-10-CM

## 2020-07-07 MED ORDER — GADOBENATE DIMEGLUMINE 529 MG/ML IV SOLN
20.0000 mL | Freq: Once | INTRAVENOUS | Status: AC | PRN
  Administered 2020-07-07: 20 mL via INTRAVENOUS

## 2020-07-19 ENCOUNTER — Ambulatory Visit: Payer: 59 | Admitting: Licensed Clinical Social Worker

## 2020-08-02 ENCOUNTER — Ambulatory Visit (INDEPENDENT_AMBULATORY_CARE_PROVIDER_SITE_OTHER): Payer: 59 | Admitting: Licensed Clinical Social Worker

## 2020-08-02 ENCOUNTER — Other Ambulatory Visit: Payer: Self-pay

## 2020-08-02 DIAGNOSIS — F4323 Adjustment disorder with mixed anxiety and depressed mood: Secondary | ICD-10-CM

## 2020-08-02 NOTE — Progress Notes (Signed)
Virtual Visit via Telephone Note  I connected with Melissa Franklin on 08/02/20 at  9:00 AM EDT by telephone and verified that I am speaking with the correct person using two identifiers.  Location: Patient: home Provider: [facility]   I discussed the limitations, risks, security and privacy concerns of performing an evaluation and management service by telephone and the availability of in person appointments. I also discussed with the patient that there may be a patient responsible charge related to this service. The patient expressed understanding and agreed to proceed.     The patient was advised to call back or seek an in-person evaluation if the symptoms worsen or if the condition fails to improve as anticipated.  I provided 60 minutes of non-face-to-face time during this encounter.   Coleman Kalas R Naureen Benton, LCSW   THERAPIST PROGRESS NOTE  Session Time: 9:00-10:00a  Participation Level: Active  Behavioral Response: NAAlertAnxious  Type of Therapy: Individual Therapy  Treatment Goals addressed: Anxiety and Coping  Interventions: Solution Focused, Supportive and Family Systems  Summary: Melissa Franklin is a 44 y.o. female who presents with continuing anxiety symptoms related to adjustment disorder diagnosis. Pt reports that she is compliant with treatment recommendations and that she is getting good quality and quantity of sleep.  Allowed pt to explore and express thoughts and feelings centered around life events: relationship with mother in law, concerns about daughter, relationship with sister and mother, relationship with best friend.  Discussed recent family situation with MIL. Allowed pt to express thoughts and feelings around recent incident that pt feels was not resolved. Used perspective reframing to look at situation differently. Pt having interaction with MIL tonight. Coached pt on ways to communicate and allowed pt to role-play to help alleviate anxiety surrounding  communicating/confronting MIL about feelings.  Pt reports that daughter is doing well--driving more and becoming more independent.  Pt reports relationships with sister, mother, best friend, and husband are all doing well.  Encouraged pt to continue focusing on self care and overall life balance. Reviewed anxiety/stress management techniques.  Suicidal/Homicidal: No  SI, HI, or AVH reported at time of session.  Therapist Response: Melissa Franklin continues to develop communication and relationship skills. Developments continue in the areas of family and relational functioning. Treatment continues to show good evolution and development.   Plan: Return again in 3 weeks.The ongoing treatment plan includes maintaining current levels of progress and continuing to build skills to manage mood, improve stress/anxiety management, emotion regulation, distress tolerance, and behavior modification.   Diagnosis: Axis I: Adjustment Disorder with Mixed Emotional Features    Axis II: No diagnosis    Ernest Haber Sayre Mazor, LCSW 08/02/2020

## 2020-08-23 ENCOUNTER — Other Ambulatory Visit: Payer: Self-pay

## 2020-08-23 ENCOUNTER — Ambulatory Visit (INDEPENDENT_AMBULATORY_CARE_PROVIDER_SITE_OTHER): Payer: 59 | Admitting: Licensed Clinical Social Worker

## 2020-08-23 DIAGNOSIS — F4323 Adjustment disorder with mixed anxiety and depressed mood: Secondary | ICD-10-CM

## 2020-08-23 NOTE — Progress Notes (Signed)
Virtual Visit via Telephone Note  I connected with Melissa Franklin on 08/23/20 at 10:00 AM EST by telephone and verified that I am speaking with the correct person using two identifiers.  Location: Patient: home Provider: [facility]   I discussed the limitations, risks, security and privacy concerns of performing an evaluation and management service by telephone and the availability of in person appointments. I also discussed with the patient that there may be a patient responsible charge related to this service. The patient expressed understanding and agreed to proceed.   The patient was advised to call back or seek an in-person evaluation if the symptoms worsen or if the condition fails to improve as anticipated.  I provided 60 minutes of non-face-to-face time during this encounter.   [name] R Mita Vallo, LCSW   THERAPIST PROGRESS NOTE  Session Time: 10:00-11:00a  Participation Level: Active  Behavioral Response: NAAlertAnxious  Type of Therapy: Individual Therapy  Treatment Goals addressed: Anxiety and Coping  Interventions: CBT, Solution Focused and Supportive  Summary: Melissa Franklin is a 44 y.o. female who presents with fluctuating symptoms related to adjustment disorder diagnosis. Pt reports that she is compliant with medication and getting good quality and quantity of sleep.  Allowed pt to explore and express thoughts and feelings associated with family relationships--relationship with daughter and relationship with husband. Discussed relationship in depth with husband and allowed pt to process through some trauma-provoking situations that have happened in the past.   Encouraged pt to continue focusing on self care, positive social engagement, adequate rest, mindfulness, life balance, and physical activity.  Suicidal/Homicidal: No  SI, HI, or AVH reported at time of session.  Therapist Response: Melissa Franklin reports that symptoms are fluctuating depending on situational stressors.  Pt does report that her ability to cope with the fluctuations has improved, which is indicative of progress.   Plan: Return again in 3 weeks.  SI, HI, or AVH reported at time of session.  Diagnosis: Axis I: Adjustment Disorder with Mixed Emotional Features    Axis II: No diagnosis    Ernest Haber Bridgitt Raggio, LCSW 08/23/2020

## 2020-09-13 ENCOUNTER — Ambulatory Visit (INDEPENDENT_AMBULATORY_CARE_PROVIDER_SITE_OTHER): Payer: 59 | Admitting: Licensed Clinical Social Worker

## 2020-09-13 ENCOUNTER — Other Ambulatory Visit: Payer: Self-pay

## 2020-09-13 DIAGNOSIS — F4323 Adjustment disorder with mixed anxiety and depressed mood: Secondary | ICD-10-CM

## 2020-09-13 NOTE — Progress Notes (Signed)
Virtual Visit via Telephone Note  I connected with Melissa Franklin on 09/13/20 at 10:00 AM EST by telephone and verified that I am speaking with the correct person using two identifiers.  Location: Patient: home  Provider: [facility]   I discussed the limitations, risks, security and privacy concerns of performing an evaluation and management service by telephone and the availability of in person appointments. I also discussed with the patient that there may be a patient responsible charge related to this service. The patient expressed understanding and agreed to proceed.  The patient was advised to call back or seek an in-person evaluation if the symptoms worsen or if the condition fails to improve as anticipated.  I provided 60 minutes of non-face-to-face time during this encounter.   Melissa Mordecai R Jeremia Groot, LCSW   THERAPIST PROGRESS NOTE  Session Time: 10-11a  Participation Level: Active  Behavioral Response: Neat and Well GroomedAlertAnxious  Type of Therapy: Individual Therapy  Treatment Goals addressed: Anxiety and Coping  Interventions: CBT, Supportive and Reframing  Summary: Melissa Franklin is a 44 y.o. female who presents with symptoms consistent with adjustment disorder (anxiety/depression). [name] admits that her symptoms are often triggered by environmental influences.  Allowed pt to explore and express thoughts and feelings about family relationships and work-related stress. Pt feels that she is unsure on what direction to take to be as supportive to daughter as she can be--feels often overwhelmed with thoughts of what she could do. "I need to take care of myself". Pt admits that she often doesn't have the motivation and initiative to follow through with her own self care activities--or do things that will be helpful with her own stress/anxiety management. Discussed several activities that could help pt feel more in control of self and life around her.  Discussed work-related  concerns. Pt is fearful that she could possibly lose job. Pt reports that she may look in different department for similar job.   Discussed pt feeling more in control of life--gave homework assignment re: control. Will discuss next session.  Encouraged more focus on self care, life management/balance.  Suicidal/Homicidal: No  Therapist Response: [name] continues to demonstrate progress in the ability to identify emotional triggers and improve levels of emotion regulation. Pt reports that she needs to continue to develop skills focusing on self care and life balance. Overall progress fluctuating/intermittent. Treatment to continue as indicated.  Plan: Return again in 3 weeks.  Diagnosis: Axis I: Adjustment Disorder with Mixed Emotional Features    Axis II: No diagnosis    Ernest Haber Devyne Hauger, LCSW 09/13/2020

## 2020-10-05 ENCOUNTER — Ambulatory Visit (INDEPENDENT_AMBULATORY_CARE_PROVIDER_SITE_OTHER): Payer: 59 | Admitting: Licensed Clinical Social Worker

## 2020-10-05 ENCOUNTER — Other Ambulatory Visit: Payer: Self-pay

## 2020-10-05 DIAGNOSIS — F4323 Adjustment disorder with mixed anxiety and depressed mood: Secondary | ICD-10-CM

## 2020-10-05 NOTE — Progress Notes (Addendum)
Virtual Visit via Telephone Note  I connected with Melissa Franklin on 10/05/20 at  9:00 AM EST by an audio enabled telemedicine application and verified that I am speaking with the correct person using two identifiers.  Location: Patient: home Provider: remote office [place], [place])   I discussed the limitations of evaluation and management by telemedicine and the availability of in person appointments. The patient expressed understanding and agreed to proceed.  The patient was advised to call back or seek an in-person evaluation if the symptoms worsen or if the condition fails to improve as anticipated.  I provided *55 minutes of non-face-to-face time during this encounter.   Perrin Gens R Karter Hellmer, LCSW    THERAPIST PROGRESS NOTE  Session Time: 9-9:55a  Participation Level: Active  Behavioral Response: NAAlertAnxious and Depressed  Type of Therapy: Individual Therapy  Treatment Goals addressed: Anxiety and Coping  Interventions: CBT, Solution Focused and Supportive  Summary: Melissa Franklin is a 44 y.o. female who presents with symptoms consistent with adjustment disorder. Pt reporting occasional depression and anxiety symptoms. Overall pt feels mood is stable and that she is getting good quality sleep. Some continuing situational agitation/irritability.   Allowed pt to explore and express thoughts and feelings surrounding daughter's continuing life choices, exploring fun activities to help build marriage, explored relationships with sister/mother/MIL, and personal goals for pt.   Encouraged continuing focus on self care, life balance, and positive social support.    Suicidal/Homicidal: No  Therapist Response: Melissa Franklin is increasing her participation in daily social and vocational activities, which is a goal to help pt manage mood and stress. This is indicative of continuing progress.   Plan: Return again in 4 weeks.  Diagnosis: Axis I: Adjustment Disorder with Mixed  Emotional Features    Axis II: No diagnosis    Ernest Haber Lamin Chandley, LCSW 10/05/2020

## 2020-11-23 ENCOUNTER — Other Ambulatory Visit: Payer: Self-pay | Admitting: *Deleted

## 2020-11-23 DIAGNOSIS — D500 Iron deficiency anemia secondary to blood loss (chronic): Secondary | ICD-10-CM

## 2020-11-23 DIAGNOSIS — N920 Excessive and frequent menstruation with regular cycle: Secondary | ICD-10-CM

## 2020-12-06 ENCOUNTER — Other Ambulatory Visit: Payer: 59

## 2021-01-25 DIAGNOSIS — H6992 Unspecified Eustachian tube disorder, left ear: Secondary | ICD-10-CM | POA: Insufficient documentation

## 2021-11-27 ENCOUNTER — Other Ambulatory Visit (HOSPITAL_COMMUNITY): Payer: Self-pay

## 2021-11-28 ENCOUNTER — Inpatient Hospital Stay (HOSPITAL_COMMUNITY): Admission: RE | Admit: 2021-11-28 | Payer: 59 | Source: Ambulatory Visit

## 2021-12-08 ENCOUNTER — Other Ambulatory Visit: Payer: Self-pay

## 2021-12-08 ENCOUNTER — Encounter (HOSPITAL_COMMUNITY)
Admission: RE | Admit: 2021-12-08 | Discharge: 2021-12-08 | Disposition: A | Discharge status: Home / Self Care | Payer: No Typology Code available for payment source | Source: Ambulatory Visit | Attending: Internal Medicine | Admitting: Internal Medicine

## 2021-12-08 DIAGNOSIS — D649 Anemia, unspecified: Secondary | ICD-10-CM | POA: Insufficient documentation

## 2021-12-08 MED ORDER — SODIUM CHLORIDE 0.9 % IV SOLN
510.0000 mg | INTRAVENOUS | Status: DC
  Administered 2021-12-08: 510 mg via INTRAVENOUS
  Filled 2021-12-08: qty 17

## 2021-12-15 ENCOUNTER — Encounter (HOSPITAL_COMMUNITY)
Admission: RE | Admit: 2021-12-15 | Discharge: 2021-12-15 | Disposition: A | Discharge status: Home / Self Care | Payer: No Typology Code available for payment source | Source: Ambulatory Visit | Attending: Internal Medicine | Admitting: Internal Medicine

## 2021-12-15 ENCOUNTER — Encounter (HOSPITAL_COMMUNITY): Payer: Self-pay

## 2021-12-15 DIAGNOSIS — D649 Anemia, unspecified: Secondary | ICD-10-CM | POA: Diagnosis not present

## 2021-12-15 MED ORDER — SODIUM CHLORIDE 0.9 % IV SOLN
510.0000 mg | INTRAVENOUS | Status: DC
  Administered 2021-12-15: 510 mg via INTRAVENOUS
  Filled 2021-12-15: qty 510

## 2021-12-18 ENCOUNTER — Encounter (HOSPITAL_COMMUNITY): Payer: No Typology Code available for payment source

## 2022-08-21 ENCOUNTER — Encounter (INDEPENDENT_AMBULATORY_CARE_PROVIDER_SITE_OTHER): Payer: Self-pay

## 2022-09-12 ENCOUNTER — Encounter (INDEPENDENT_AMBULATORY_CARE_PROVIDER_SITE_OTHER): Payer: Self-pay | Admitting: Family Medicine

## 2022-12-13 ENCOUNTER — Other Ambulatory Visit: Payer: Self-pay | Admitting: Internal Medicine

## 2022-12-13 DIAGNOSIS — Z8679 Personal history of other diseases of the circulatory system: Secondary | ICD-10-CM

## 2022-12-17 ENCOUNTER — Other Ambulatory Visit: Payer: Self-pay | Admitting: Internal Medicine

## 2022-12-17 DIAGNOSIS — Z8679 Personal history of other diseases of the circulatory system: Secondary | ICD-10-CM

## 2023-01-08 ENCOUNTER — Other Ambulatory Visit (HOSPITAL_COMMUNITY): Payer: Self-pay

## 2023-01-08 MED ORDER — MOUNJARO 5 MG/0.5ML ~~LOC~~ SOAJ
5.0000 mg | SUBCUTANEOUS | 5 refills | Status: AC
  Filled 2023-01-08: qty 2, 28d supply, fill #0

## 2023-01-11 ENCOUNTER — Ambulatory Visit
Admission: RE | Admit: 2023-01-11 | Discharge: 2023-01-11 | Disposition: A | Discharge status: Home / Self Care | Payer: No Typology Code available for payment source | Source: Ambulatory Visit | Attending: Internal Medicine | Admitting: Internal Medicine

## 2023-01-11 DIAGNOSIS — Z8679 Personal history of other diseases of the circulatory system: Secondary | ICD-10-CM

## 2023-01-12 ENCOUNTER — Ambulatory Visit
Admission: EM | Admit: 2023-01-12 | Discharge: 2023-01-12 | Disposition: A | Discharge status: Home / Self Care | Payer: No Typology Code available for payment source

## 2023-01-12 DIAGNOSIS — I10 Essential (primary) hypertension: Secondary | ICD-10-CM | POA: Insufficient documentation

## 2023-01-12 DIAGNOSIS — H1131 Conjunctival hemorrhage, right eye: Secondary | ICD-10-CM | POA: Diagnosis not present

## 2023-01-12 DIAGNOSIS — E119 Type 2 diabetes mellitus without complications: Secondary | ICD-10-CM | POA: Insufficient documentation

## 2023-01-12 HISTORY — DX: Disorder of thyroid, unspecified: E07.9

## 2023-01-12 HISTORY — DX: Essential (primary) hypertension: I10

## 2023-01-12 HISTORY — DX: Type 2 diabetes mellitus without complications: E11.9

## 2023-01-12 NOTE — ED Triage Notes (Signed)
[facility] with [facility] of injury to right eye since yesterday evening. She is not sure what happened but she was removing eye make up and now it irritated. She was using her normal products.

## 2023-01-12 NOTE — Discharge Instructions (Signed)
It appears that you have a subconjunctival hemorrhage.  See attached patient education regarding this.  This should reabsorb in 2 weeks.  It could have occurred from rubbing your eyes.  Although, if symptoms persist or worsen or if headache worsens, please go to the emergency department for further evaluation.

## 2023-01-12 NOTE — ED Provider Notes (Signed)
[facility]    CSN: [id number] Arrival date & time: 01/12/23  1040      History   Chief Complaint Chief Complaint  Patient presents with   Eye Problem    HPI Melissa Franklin is a 47 y.o. female.   Patient presents with right eye redness that started yesterday evening.  Patient reports that she noticed it after she was rubbing her eyes with make-up remover and a cottonball to remove her make-up.  Patient denies any other obvious foreign body or trauma to the eye.  Denies blurry vision.  She does wear glasses but does not wear contacts.  Denies eye pain but reports that it "feels irritated".  She does had a mild headache last night as well which she took ibuprofen for with resolution.  The headache returned this morning.  Patient not reporting any dizziness, blurred vision, nausea, vomiting.  Denies any recent falls or head trauma.  Denies history of migraines.   Eye Problem   Past Medical History:  Diagnosis Date   Anemia    Anxiety    Bell palsy    ?   Depression    Diabetes mellitus without complication    GERD (gastroesophageal reflux disease)    Hypertension    SOB (shortness of breath)    Thyroid disease    Trigeminal neuralgia    Vitamin D deficiency     Patient Active Problem List   Diagnosis Date Noted   Diabetes 01/12/2023   High blood pressure 01/12/2023   Dysfunction of left eustachian tube 01/25/2021   Myofascial pain syndrome 06/11/2019   Blood in feces 08/09/2014   Trigeminal neuralgia    Trigeminal neuralgia of left side of face 03/09/2013   Abnormal vaginal bleeding 01/12/2013   Changeable mood 01/12/2013   Hand paresthesia 01/12/2013   Acute antritis 08/07/2012   Middle ear infection 08/07/2012   Depression 12/19/2011   Adiposity 12/19/2011   Esophageal reflux 12/19/2011   Health examination of defined subpopulation 12/19/2011   Acne inversa 06/18/2011   Abdominal cramping 04/02/2011   Laryngeal pain 02/20/2011   Absolute  anemia 09/20/2010   Vitamin D deficiency, unspecified 09/20/2010   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      Home Medications    Prior to Admission medications   Medication Sig Start Date End Date Taking? Authorizing Provider  citalopram (CELEXA) 20 MG tablet 20 mg. 01/12/13  Yes [provider]  Cyanocobalamin (B-12) 5000 MCG SUBL  01/25/21  Yes [provider]  ferrous sulfate 324 (65 Fe) MG TBEC FERROUS SULFATE 325 (65 Fe) MG TABS 07/12/14  Yes [provider]  gabapentin (NEURONTIN) 100 MG capsule Take 100 mg by mouth 2 (two) times daily. 01/02/23  Yes [provider]  LAGEVRIO 200 MG CAPS capsule SMARTSIG:4 Capsule(s) By Mouth Every 12 Hours 11/08/22  Yes [provider]  levothyroxine (SYNTHROID) 100 MCG tablet Take 100 mcg by mouth daily. 11/01/22  Yes [provider]  levothyroxine (SYNTHROID) 112 MCG tablet Take 112 mcg by mouth every morning. 01/01/23  Yes [provider]  levothyroxine (SYNTHROID) 50 MCG tablet Euthyrox 50 mcg tablet 01/25/21  Yes [provider]  levothyroxine (SYNTHROID) 88 MCG tablet Take 88 mcg by mouth daily. 07/20/22  Yes [provider]  Liraglutide -Weight  Management (SAXENDA) 18 MG/3ML SOPN Saxenda 3 mg/0.5 mL (18 mg/3 mL) subcutaneous pen injector 01/25/21  Yes [provider]  meloxicam (MOBIC) 15 MG tablet meloxicam 15 mg tablet 01/25/21  Yes [provider]  metFORMIN (GLUCOPHAGE-XR) 500 MG 24 hr tablet Take 500 mg by mouth daily. 11/23/22  Yes [provider]  methenamine (HIPREX) 1 g tablet 1 tablet 01/25/21  Yes [provider]  MOUNJARO 2.5 MG/0.5ML Pen SMARTSIG:2.5 SUB-Q Once a Week 10/05/22  Yes [provider]  nitrofurantoin, macrocrystal-monohydrate, (MACROBID) 100 MG capsule nitrofurantoin  monohydrate/macrocrystals 100 mg capsule 01/25/21  Yes [provider]  olmesartan (BENICAR) 20 MG tablet Take 20 mg by mouth daily. 01/01/23  Yes [provider]  omeprazole (PRILOSEC) 40 MG capsule 40 mg. 12/19/11  Yes [provider]  sertraline (ZOLOFT) 100 MG tablet sertraline 100 mg tablet 01/25/21  Yes [provider]  VITAMIN D, ERGOCALCIFEROL, PO VITAMIN D (ERGOCALCIFEROL) 50000 UNIT CAPS 11/23/10  Yes [provider]  Cyanocobalamin (B-12) 5000 MCG SUBL     [provider]  ferrous sulfate 325 (65 FE) MG tablet Take 325 mg by mouth daily with breakfast.    [provider]  Gabapentin, PHN, 300 MG TABS Take 300 mg by mouth 3 (three) times daily. 03/31/13   Levert Feinstein, MD  Phentermine-Topiramate [facility]) 3.75-23 MG CP24     [provider]  Phentermine-Topiramate [facility]) 7.5-46 MG CP24     [provider]  sertraline (ZOLOFT) 50 MG tablet     [provider]  sulfamethoxazole-trimethoprim (BACTRIM DS) 800-160 MG tablet     [provider]  tirzepatide [facility]gg[facility] Keen) 5 MG/0.5ML Pen Inject 5 mg into the skin once a week. 11/01/22     Vitamin D, Ergocalciferol, (DRISDOL) 50000 UNITS CAPS Take 50,000 Units by mouth 3 (three) times a week.    [provider]    Family History Family History  Problem Relation Age of Onset   Diabetes Other    Breast cancer Maternal Aunt        2 maternal aunts and cousin   Thyroid disease Other    Thyroid disease Mother    Stroke Maternal Grandmother    Bell's palsy Cousin    Breast cancer Cousin    Heart attack Other     Social History Social History   Tobacco Use   Smoking status: Never   Smokeless tobacco: Never  Substance Use Topics   Alcohol use: Yes    Comment: Consumes alcohol maybe one per month   Drug use: No     Allergies   Doxycycline   Review of Systems Review of Systems Per HPI  Physical Exam Triage Vital Signs ED Triage  Vitals  Enc Vitals Group     BP 01/12/23 1112 127/83     Pulse Rate 01/12/23 1341 77     Resp 01/12/23 1111 16     Temp 01/12/23 1113 98 F (36.7 C)     Temp src --      SpO2 01/12/23 1111 98 %     Weight --      Height --      Head Circumference --      Peak Flow --      Pain Score 01/12/23 1109 0     Pain Loc --      Pain Edu? --      Excl. in GC? --    No data found.  Updated Vital Signs BP 127/83  Pulse 77   Temp 98 F (36.7 C)   Resp 16   LMP 01/01/2023 (Approximate)   SpO2 98%   Visual Acuity Right Eye Distance: 20/25 Left Eye Distance: 20/25 Bilateral Distance: 20/25 (with corrective lenses)  Right Eye Near:   Left Eye Near:    Bilateral Near:     Physical Exam Constitutional:      General: She is not in acute distress.    Appearance: Normal appearance. She is not toxic-appearing or diaphoretic.  HENT:     Head: Normocephalic and atraumatic.  Eyes:     General: Lids are normal.     Extraocular Movements: Extraocular movements intact.     Conjunctiva/sclera: Conjunctivae normal.     Pupils: Pupils are equal, round, and reactive to light.     Right eye: No corneal abrasion or fluorescein uptake.      Comments: Patient has subconjunctival hemorrhage present to sclera of right eye at right portion.  No obvious fluorescein reuptake notable for corneal abrasion or ulcer.  Pulmonary:     Effort: Pulmonary effort is normal.  Neurological:     General: No focal deficit present.     Mental Status: She is alert and oriented to person, place, and time. Mental status is at baseline.     Cranial Nerves: Cranial nerves 2-12 are intact.     Sensory: Sensation is intact.     Motor: Motor function is intact.     Coordination: Coordination is intact.     Gait: Gait is intact.  Psychiatric:        Mood and Affect: Mood normal.        Behavior: Behavior normal.        Thought Content: Thought content normal.        Judgment: Judgment normal.      UC  Treatments / Results  Labs (all labs ordered are listed, but only abnormal results are displayed) Labs Reviewed - No data to display  EKG   Radiology CT CARDIAC SCORING ([facility] LOCATIONS ONLY)  Result Date: 01/11/2023 CLINICAL DATA:  High cholesterol, hypertension, diabetes * Tracking Code: FCC * EXAM: CT CARDIAC CORONARY ARTERY CALCIUM SCORE TECHNIQUE: Non-contrast imaging through the heart was performed using prospective ECG gating. Image post processing was performed on an independent workstation, allowing for quantitative analysis of the heart and coronary arteries. Note that this exam targets the heart and the chest was not imaged in its entirety. COMPARISON:  None available. FINDINGS: CORONARY CALCIUM SCORES: Left Main: 0 LAD: 0 LCx: 0 RCA: 0 Total Agatston Score: 0 MESA database percentile: 0 AORTA MEASUREMENTS: Ascending Aorta: 3.0 cm Descending Aorta:2.1 cm OTHER FINDINGS: Heart is normal size. Aorta normal caliber. No adenopathy. No confluent airspace opacities or effusions. No acute findings in the upper abdomen. Chest wall soft tissues are unremarkable. No acute bony abnormality. IMPRESSION: No visible coronary artery calcifications. Total coronary calcium score of 0. No acute or significant extracardiac abnormality. Electronically Signed   By: Charlett NoseKevin  Dover M.D.   On: 01/11/2023 16:32    Procedures Procedures (including critical care time)  Medications Ordered in UC Medications - No data to display  Initial Impression / Assessment and Plan / UC Course  I have reviewed the triage vital signs and the nursing notes.  Pertinent labs & imaging results that were available during my care of the patient were reviewed by me and considered in my medical decision making (see chart for details).     Physical exam is consistent  with subconjunctival hemorrhage of right eye.  No fluorescein reuptake noted that is concerning for corneal abrasion or ulcer.  Patient most likely has subconjunctival  hemorrhage from rubbing her eyes to remove make-up.  Patient does have a very mild headache but neuroexam is normal and I am not sure that these 2 etiologies are related.  Therefore, do not think that emergent evaluation or imaging of the head is necessary at this time.  Blood pressure is also normal which is reassuring.  Although, patient was advised to go to the emergency department if headache persists or worsens, blurred vision occurs, or if any symptoms persist or worsen.  Advised patient that it will reabsorb in the next 2 weeks or so.  Follow-up with ophthalmology if it does not resolve.  Do not think erythromycin antibiotic ointment is necessary given no abrasions noted and patient did not have any obvious trauma to the eye.  Visual acuity is normal.  Patient verbalized understanding and was agreeable with plan. Final Clinical Impressions(s) / UC Diagnoses   Final diagnoses:  Subconjunctival hemorrhage of right eye     Discharge Instructions      It appears that you have a subconjunctival hemorrhage.  See attached patient education regarding this.  This should reabsorb in 2 weeks.  It could have occurred from rubbing your eyes.  Although, if symptoms persist or worsen or if headache worsens, please go to the emergency department for further evaluation.    ED Prescriptions   None    PDMP not reviewed this encounter.   Gustavus BryantMound, Thelonious Kauffmann E, OregonFNP 01/12/23 1419

## 2023-01-31 ENCOUNTER — Other Ambulatory Visit (HOSPITAL_COMMUNITY): Payer: Self-pay

## 2023-01-31 MED ORDER — MOUNJARO 7.5 MG/0.5ML ~~LOC~~ SOAJ
7.5000 mg | SUBCUTANEOUS | 5 refills | Status: DC
  Filled 2023-01-31: qty 2, 28d supply, fill #0
  Filled 2023-02-24: qty 2, 28d supply, fill #1
  Filled 2023-03-24: qty 2, 28d supply, fill #2
  Filled 2023-04-21: qty 2, 28d supply, fill #3
  Filled 2023-05-17: qty 2, 28d supply, fill #4
  Filled 2023-06-13: qty 2, 28d supply, fill #5

## 2023-02-26 ENCOUNTER — Other Ambulatory Visit (HOSPITAL_COMMUNITY): Payer: Self-pay

## 2023-02-26 ENCOUNTER — Encounter: Payer: Self-pay | Admitting: Pharmacist

## 2023-02-26 ENCOUNTER — Other Ambulatory Visit: Payer: Self-pay

## 2023-05-09 ENCOUNTER — Other Ambulatory Visit (HOSPITAL_COMMUNITY): Payer: Self-pay

## 2023-05-17 ENCOUNTER — Other Ambulatory Visit: Payer: Self-pay

## 2023-07-24 ENCOUNTER — Other Ambulatory Visit (HOSPITAL_COMMUNITY): Payer: Self-pay

## 2023-07-28 ENCOUNTER — Encounter (HOSPITAL_COMMUNITY): Payer: Self-pay

## 2023-07-29 ENCOUNTER — Other Ambulatory Visit (HOSPITAL_COMMUNITY): Payer: Self-pay

## 2023-08-05 ENCOUNTER — Other Ambulatory Visit (HOSPITAL_COMMUNITY): Payer: Self-pay

## 2023-08-05 MED ORDER — MOUNJARO 7.5 MG/0.5ML ~~LOC~~ SOAJ
7.5000 mg | SUBCUTANEOUS | 5 refills | Status: DC
  Filled 2023-08-05: qty 2, 28d supply, fill #0
  Filled 2023-08-30: qty 2, 28d supply, fill #1
  Filled 2023-09-29: qty 2, 28d supply, fill #2
  Filled 2023-10-27: qty 2, 28d supply, fill #3
  Filled 2023-11-21: qty 2, 28d supply, fill #4
  Filled 2023-12-19: qty 2, 28d supply, fill #5

## 2023-11-26 ENCOUNTER — Other Ambulatory Visit (HOSPITAL_COMMUNITY): Payer: Self-pay

## 2024-01-23 ENCOUNTER — Other Ambulatory Visit: Payer: Self-pay

## 2024-01-23 ENCOUNTER — Other Ambulatory Visit (HOSPITAL_COMMUNITY): Payer: Self-pay

## 2024-01-23 MED ORDER — MOUNJARO 7.5 MG/0.5ML ~~LOC~~ SOAJ
7.5000 mg | SUBCUTANEOUS | 5 refills | Status: AC
  Filled 2024-01-23 – 2024-01-29 (×3): qty 2, 28d supply, fill #0
  Filled 2024-02-22: qty 2, 28d supply, fill #1
  Filled 2024-03-21: qty 2, 28d supply, fill #2
  Filled 2024-04-27: qty 2, 28d supply, fill #3
  Filled 2024-05-23: qty 2, 28d supply, fill #4

## 2024-01-28 ENCOUNTER — Other Ambulatory Visit: Payer: Self-pay

## 2024-01-29 ENCOUNTER — Other Ambulatory Visit: Payer: Self-pay

## 2024-01-29 ENCOUNTER — Other Ambulatory Visit (HOSPITAL_COMMUNITY): Payer: Self-pay

## 2024-02-24 ENCOUNTER — Other Ambulatory Visit (HOSPITAL_COMMUNITY): Payer: Self-pay

## 2024-03-21 ENCOUNTER — Other Ambulatory Visit (HOSPITAL_COMMUNITY): Payer: Self-pay

## 2024-03-23 ENCOUNTER — Other Ambulatory Visit: Payer: Self-pay

## 2024-04-28 ENCOUNTER — Other Ambulatory Visit (HOSPITAL_COMMUNITY): Payer: Self-pay

## 2024-05-23 ENCOUNTER — Other Ambulatory Visit (HOSPITAL_COMMUNITY): Payer: Self-pay

## 2024-05-29 ENCOUNTER — Other Ambulatory Visit (HOSPITAL_COMMUNITY): Payer: Self-pay

## 2024-05-29 MED ORDER — MOUNJARO 10 MG/0.5ML ~~LOC~~ SOAJ
10.0000 mg | SUBCUTANEOUS | 5 refills | Status: AC
  Filled 2024-05-29 – 2024-06-19 (×3): qty 2, 28d supply, fill #0
  Filled 2024-07-15 – 2024-07-27 (×2): qty 2, 28d supply, fill #1
  Filled 2024-08-20: qty 2, 28d supply, fill #2
  Filled 2024-09-16: qty 2, 28d supply, fill #3
  Filled 2024-10-15: qty 2, 28d supply, fill #4
  Filled 2024-11-12: qty 2, 28d supply, fill #5

## 2024-06-01 ENCOUNTER — Other Ambulatory Visit (HOSPITAL_COMMUNITY): Payer: Self-pay

## 2024-06-01 ENCOUNTER — Other Ambulatory Visit: Payer: Self-pay

## 2024-06-19 ENCOUNTER — Other Ambulatory Visit (HOSPITAL_COMMUNITY): Payer: Self-pay

## 2024-07-15 ENCOUNTER — Other Ambulatory Visit (HOSPITAL_COMMUNITY): Payer: Self-pay

## 2024-07-15 ENCOUNTER — Other Ambulatory Visit: Payer: Self-pay

## 2024-07-20 ENCOUNTER — Other Ambulatory Visit: Payer: Self-pay

## 2024-07-27 ENCOUNTER — Other Ambulatory Visit (HOSPITAL_COMMUNITY): Payer: Self-pay

## 2024-08-20 ENCOUNTER — Other Ambulatory Visit (HOSPITAL_COMMUNITY): Payer: Self-pay

## 2024-09-16 ENCOUNTER — Other Ambulatory Visit (HOSPITAL_BASED_OUTPATIENT_CLINIC_OR_DEPARTMENT_OTHER): Payer: Self-pay

## 2024-10-15 ENCOUNTER — Other Ambulatory Visit: Payer: Self-pay

## 2024-11-12 ENCOUNTER — Other Ambulatory Visit: Payer: Self-pay
